# Patient Record
Sex: Male | Born: 1945 | Race: White | Hispanic: No | Marital: Married | Smoking: Never smoker
Health system: Southern US, Community
[De-identification: ages and names within clinical notes are randomized; demographics above are authoritative.]

## PROBLEM LIST (undated history)

## (undated) DIAGNOSIS — C801 Malignant (primary) neoplasm, unspecified: Secondary | ICD-10-CM

## (undated) DIAGNOSIS — F0390 Unspecified dementia without behavioral disturbance: Secondary | ICD-10-CM

## (undated) DIAGNOSIS — T7840XA Allergy, unspecified, initial encounter: Secondary | ICD-10-CM

## (undated) HISTORY — PX: COLONOSCOPY: SHX174

## (undated) HISTORY — DX: Allergy, unspecified, initial encounter: T78.40XA

## (undated) HISTORY — PX: NO PAST SURGERIES: SHX2092

## (undated) HISTORY — DX: Malignant (primary) neoplasm, unspecified: C80.1

## (undated) HISTORY — DX: Unspecified dementia, unspecified severity, without behavioral disturbance, psychotic disturbance, mood disturbance, and anxiety: F03.90

## (undated) HISTORY — PX: MOHS SURGERY: SUR867

---

## 2004-07-06 ENCOUNTER — Ambulatory Visit: Payer: Self-pay | Admitting: Family Medicine

## 2004-07-22 ENCOUNTER — Ambulatory Visit: Payer: Self-pay | Admitting: Family Medicine

## 2005-01-12 ENCOUNTER — Ambulatory Visit: Payer: Self-pay | Admitting: Gastroenterology

## 2005-01-28 ENCOUNTER — Ambulatory Visit: Payer: Self-pay | Admitting: Gastroenterology

## 2005-01-28 ENCOUNTER — Encounter (INDEPENDENT_AMBULATORY_CARE_PROVIDER_SITE_OTHER): Payer: Self-pay | Admitting: Specialist

## 2005-01-28 ENCOUNTER — Encounter: Payer: Self-pay | Admitting: Family Medicine

## 2005-07-26 ENCOUNTER — Encounter: Payer: Self-pay | Admitting: Family Medicine

## 2005-07-26 LAB — CONVERTED CEMR LAB

## 2006-02-02 ENCOUNTER — Ambulatory Visit: Payer: Self-pay | Admitting: Family Medicine

## 2006-02-14 ENCOUNTER — Ambulatory Visit: Payer: Self-pay | Admitting: Family Medicine

## 2007-03-24 ENCOUNTER — Encounter: Payer: Self-pay | Admitting: Family Medicine

## 2007-03-24 DIAGNOSIS — F528 Other sexual dysfunction not due to a substance or known physiological condition: Secondary | ICD-10-CM

## 2007-03-24 DIAGNOSIS — Z860100 Personal history of colon polyps, unspecified: Secondary | ICD-10-CM | POA: Insufficient documentation

## 2007-04-21 ENCOUNTER — Ambulatory Visit: Payer: Self-pay | Admitting: Family Medicine

## 2007-04-21 LAB — CONVERTED CEMR LAB
Albumin: 4.5 g/dL (ref 3.5–5.2)
Alkaline Phosphatase: 73 units/L (ref 39–117)
BUN: 15 mg/dL (ref 6–23)
Basophils Absolute: 0.1 10*3/uL (ref 0.0–0.1)
Bilirubin Urine: NEGATIVE
Cholesterol: 194 mg/dL (ref 0–200)
GFR calc Af Amer: 87 mL/min
Glucose, Urine, Semiquant: NEGATIVE
HDL: 53.3 mg/dL (ref >39.0)
Hemoglobin: 16 g/dL (ref 13.0–17.0)
Lymphocytes Relative: 20.1 % (ref 12.0–46.0)
MCHC: 36 g/dL (ref 30.0–36.0)
MCV: 91.9 fL (ref 78.0–100.0)
Monocytes Absolute: 0.7 10*3/uL (ref 0.2–0.7)
Monocytes Relative: 9.6 % (ref 3.0–11.0)
Neutro Abs: 4.8 10*3/uL (ref 1.4–7.7)
Neutrophils Relative %: 66.9 % (ref 43.0–77.0)
PSA: 2.42 ng/mL (ref 0.10–4.00)
Potassium: 4.9 meq/L (ref 3.5–5.1)
Sodium: 141 meq/L (ref 135–145)
TSH: 1.21 microintl units/mL (ref 0.35–5.50)
Total Protein: 6.8 g/dL (ref 6.0–8.3)
Urobilinogen, UA: NEGATIVE
pH: 5.5

## 2007-04-28 ENCOUNTER — Ambulatory Visit: Payer: Self-pay | Admitting: Family Medicine

## 2008-04-24 ENCOUNTER — Ambulatory Visit: Payer: Self-pay | Admitting: Family Medicine

## 2008-04-24 LAB — CONVERTED CEMR LAB
Albumin: 4.4 g/dL (ref 3.5–5.2)
Alkaline Phosphatase: 68 units/L (ref 39–117)
BUN: 12 mg/dL (ref 6–23)
Basophils Relative: 0.6 % (ref 0.0–3.0)
Blood in Urine, dipstick: NEGATIVE
Calcium: 9.4 mg/dL (ref 8.4–10.5)
Creatinine, Ser: 1 mg/dL (ref 0.4–1.5)
Eosinophils Absolute: 0.2 10*3/uL (ref 0.0–0.7)
Eosinophils Relative: 2.3 % (ref 0.0–5.0)
GFR calc Af Amer: 97 mL/min
GFR calc non Af Amer: 80 mL/min
Glucose, Bld: 100 mg/dL — ABNORMAL HIGH (ref 70–99)
Glucose, Urine, Semiquant: NEGATIVE
HCT: 43.8 % (ref 39.0–52.0)
HDL: 65 mg/dL (ref >39.0)
Hemoglobin: 15.6 g/dL (ref 13.0–17.0)
MCV: 93.9 fL (ref 78.0–100.0)
Monocytes Absolute: 0.5 10*3/uL (ref 0.1–1.0)
Monocytes Relative: 7.8 % (ref 3.0–12.0)
Neutro Abs: 4.3 10*3/uL (ref 1.4–7.7)
Nitrite: NEGATIVE
PSA: 2.53 ng/mL (ref 0.10–4.00)
Platelets: 215 10*3/uL (ref 150–400)
RBC: 4.66 M/uL (ref 4.22–5.81)
Specific Gravity, Urine: 1.015
Total CHOL/HDL Ratio: 2.9
Total Protein: 6.9 g/dL (ref 6.0–8.3)
WBC Urine, dipstick: NEGATIVE
WBC: 6.7 10*3/uL (ref 4.5–10.5)
pH: 7

## 2008-05-07 ENCOUNTER — Ambulatory Visit: Payer: Self-pay | Admitting: Family Medicine

## 2008-05-07 DIAGNOSIS — Z85828 Personal history of other malignant neoplasm of skin: Secondary | ICD-10-CM | POA: Insufficient documentation

## 2008-05-07 DIAGNOSIS — J309 Allergic rhinitis, unspecified: Secondary | ICD-10-CM | POA: Insufficient documentation

## 2009-05-26 ENCOUNTER — Ambulatory Visit: Payer: Self-pay | Admitting: Family Medicine

## 2009-05-26 LAB — CONVERTED CEMR LAB
AST: 37 units/L (ref 0–37)
Alkaline Phosphatase: 74 units/L (ref 39–117)
BUN: 14 mg/dL (ref 6–23)
Basophils Relative: 1 % (ref 0.0–3.0)
Bilirubin Urine: NEGATIVE
Bilirubin, Direct: 0.1 mg/dL (ref 0.0–0.3)
CO2: 31 meq/L (ref 19–32)
Chloride: 105 meq/L (ref 96–112)
Cholesterol: 208 mg/dL — ABNORMAL HIGH (ref 0–200)
Direct LDL: 125.5 mg/dL
Eosinophils Relative: 3.1 % (ref 0.0–5.0)
Glucose, Urine, Semiquant: NEGATIVE
HCT: 43.4 % (ref 39.0–52.0)
MCHC: 35.9 g/dL (ref 30.0–36.0)
MCV: 96.8 fL (ref 78.0–100.0)
Potassium: 4.6 meq/L (ref 3.5–5.1)
Protein, U semiquant: NEGATIVE
RBC: 4.48 M/uL (ref 4.22–5.81)
Total Bilirubin: 1.1 mg/dL (ref 0.3–1.2)
Total CHOL/HDL Ratio: 3
VLDL: 13 mg/dL (ref 0.0–40.0)
WBC Urine, dipstick: NEGATIVE
WBC: 5.8 10*3/uL (ref 4.5–10.5)
pH: 5

## 2009-06-03 ENCOUNTER — Ambulatory Visit: Payer: Self-pay | Admitting: Family Medicine

## 2009-06-03 ENCOUNTER — Encounter (INDEPENDENT_AMBULATORY_CARE_PROVIDER_SITE_OTHER): Payer: Self-pay | Admitting: *Deleted

## 2009-06-03 DIAGNOSIS — R972 Elevated prostate specific antigen [PSA]: Secondary | ICD-10-CM

## 2009-07-31 ENCOUNTER — Ambulatory Visit (HOSPITAL_COMMUNITY): Admission: RE | Admit: 2009-07-31 | Discharge: 2009-07-31 | Payer: Self-pay | Admitting: Urology

## 2009-08-18 ENCOUNTER — Ambulatory Visit: Admission: RE | Admit: 2009-08-18 | Discharge: 2009-10-09 | Payer: Self-pay | Admitting: Radiation Oncology

## 2009-10-13 ENCOUNTER — Ambulatory Visit: Admission: RE | Admit: 2009-10-13 | Discharge: 2010-01-07 | Payer: Self-pay | Admitting: Radiation Oncology

## 2009-10-14 ENCOUNTER — Encounter: Admission: RE | Admit: 2009-10-14 | Discharge: 2009-10-14 | Payer: Self-pay | Admitting: Urology

## 2009-11-27 ENCOUNTER — Ambulatory Visit (HOSPITAL_BASED_OUTPATIENT_CLINIC_OR_DEPARTMENT_OTHER): Admission: RE | Admit: 2009-11-27 | Discharge: 2009-11-27 | Payer: Self-pay | Admitting: Urology

## 2010-01-02 ENCOUNTER — Encounter (INDEPENDENT_AMBULATORY_CARE_PROVIDER_SITE_OTHER): Payer: Self-pay | Admitting: *Deleted

## 2010-01-29 ENCOUNTER — Telehealth: Payer: Self-pay | Admitting: Gastroenterology

## 2010-04-10 ENCOUNTER — Encounter (INDEPENDENT_AMBULATORY_CARE_PROVIDER_SITE_OTHER): Payer: Self-pay | Admitting: *Deleted

## 2010-05-19 ENCOUNTER — Encounter (INDEPENDENT_AMBULATORY_CARE_PROVIDER_SITE_OTHER): Payer: Self-pay | Admitting: *Deleted

## 2010-05-21 ENCOUNTER — Ambulatory Visit: Payer: Self-pay | Admitting: Gastroenterology

## 2010-06-04 ENCOUNTER — Ambulatory Visit: Payer: Self-pay | Admitting: Gastroenterology

## 2010-06-07 ENCOUNTER — Encounter: Payer: Self-pay | Admitting: Gastroenterology

## 2010-06-22 ENCOUNTER — Encounter: Admission: RE | Admit: 2010-06-22 | Discharge: 2010-06-22 | Payer: Self-pay | Admitting: Internal Medicine

## 2010-08-26 NOTE — Miscellaneous (Signed)
Summary: [facility]  Clinical Lists Changes  Medications: Added new medication of MOVIPREP 100 GM  SOLR (PEG-KCL-NACL-NASULF-NA ASC-C) As per prep instructions. - Signed Rx of MOVIPREP 100 GM  SOLR (PEG-KCL-NACL-NASULF-NA ASC-C) As per prep instructions.;  #1 x 0;  Signed;  Entered by: Ezra Sites RN;  Authorized by: Meryl Dare MD Clementeen Graham;  Method used: Electronically to [facility]*, [address] [place], [place], [place]  [address], Ph: [phone], Fax: [phone] Observations: Added new observation of ALLERGY REV: Done (05/21/2010 9:59)    Prescriptions: MOVIPREP 100 GM  SOLR (PEG-KCL-NACL-NASULF-NA ASC-C) As per prep instructions.  #1 x 0   Entered by:   Ezra Sites RN   Authorized by:   Meryl Dare MD [facility]   Signed by:   Ezra Sites RN on 05/21/2010   Method used:   Electronically to        [facility]* (retail)       [address] [place], [place]  [address]       Ph: [phone]       Fax: [phone]   RxID:   [id number]

## 2010-08-26 NOTE — Letter (Signed)
Summary: Pre Visit Letter Revised  [facility]  [address] [place], [place] [address]   Phone: [phone]  Fax: [phone]        04/10/2010 MRN: [id number] Jeff Simpson [address] [place], [place]  [address]             Procedure Date:  06-04-10   Welcome to the Gastroenterology Division at [facility].    You are scheduled to see a nurse for your pre-procedure visit on 05-21-10 at 10:00a.m. on the 3rd floor at [facility], [address]  We ask that you try to arrive at our office 15 minutes prior to your appointment time to allow for check-in.  Please take a minute to review the attached form.  If you answer "Yes" to one or more of the questions on the first page, we ask that you call the person listed at your earliest opportunity.  If you answer "No" to all of the questions, please complete the rest of the form and bring it to your appointment.    Your nurse visit will consist of discussing your medical and surgical history, your immediate family medical history, and your medications.   If you are unable to list all of your medications on the form, please bring the medication bottles to your appointment and we will list them.  We will need to be aware of both prescribed and over the counter drugs.  We will need to know exact dosage information as well.    Please be prepared to read and sign documents such as consent forms, a financial agreement, and acknowledgement forms.  If necessary, and with your consent, a friend or relative is welcome to sit-in on the nurse visit with you.  Please bring your insurance card so that we may make a copy of it.  If your insurance requires a referral to see a specialist, please bring your referral form from your primary care physician.  No co-pay is required for this nurse visit.     If you cannot keep your appointment, please call [phone] to cancel or reschedule prior to your appointment date.  This allows  [place] the opportunity to schedule an appointment for another patient in need of care.    Thank you for choosing [facility] for your medical needs.  We appreciate the opportunity to care for you.  Please visit [place] at our website  to learn more about our practice.  Sincerely, The Gastroenterology Division

## 2010-08-26 NOTE — Letter (Signed)
Summary: Colonoscopy Letter  [facility]  [address] [place], [place] [address]   Phone: [phone]  Fax: [phone]      January 02, 2010 MRN: [id number]   Jeff Simpson [address] [place], [place]  [address]   Dear Jeff Simpson,   According to your medical record, it is time for you to schedule a Colonoscopy. The American Cancer Society recommends this procedure as a method to detect early colon cancer. Patients with a family history of colon cancer, or a personal history of colon polyps or inflammatory bowel disease are at increased risk.  This letter has beeen generated based on the recommendations made at the time of your procedure. If you feel that in your particular situation this may no longer apply, please contact our office.  Please call our office at [phone] to schedule this appointment or to update your records at your earliest convenience.  Thank you for cooperating with [place] to provide you with the very best care possible.   Sincerely,  Judie Petit T. Russella Dar, M.D.  [facility] Gastroenterology Division [phone]

## 2010-08-26 NOTE — Letter (Signed)
Summary: [facility] Instructions  [facility]  [address] [place], [place] [address]   Phone: [phone]  Fax: [phone]       Jeff Simpson    [birth year]    MRN: [id number]        Procedure Day Dorna Bloom: Lenor Coffin 06/04/10     Arrival Time: 8:30am     Procedure Time: 9:30am     Location of Procedure:                    Juliann Pares _  [facility] (4th Floor)                       PREPARATION FOR COLONOSCOPY WITH MOVIPREP   Starting 5 days prior to your procedure  SATURDAY 11/05  do not eat nuts, seeds, popcorn, corn, beans, peas,  salads, or any raw vegetables.  Do not take any fiber supplements (e.g. Metamucil, Citrucel, and Benefiber).  THE DAY BEFORE YOUR PROCEDURE         DATE:  [facility]  11/09  1.  Drink clear liquids the entire day-NO SOLID FOOD  2.  Do not drink anything colored red or purple.  Avoid juices with pulp.  No orange juice.  3.  Drink at least 64 oz. (8 glasses) of fluid/clear liquids during the day to prevent dehydration and help the prep work efficiently.  CLEAR LIQUIDS INCLUDE: Water Jello Ice Popsicles Tea (sugar ok, no milk/cream) Powdered fruit flavored drinks Coffee (sugar ok, no milk/cream) Gatorade Juice: apple, white grape, white cranberry  Lemonade Clear bullion, consomm, broth Carbonated beverages (any kind) Strained chicken noodle soup Hard Candy                             4.  In the morning, mix first dose of MoviPrep solution:    Empty 1 Pouch A and 1 Pouch B into the disposable container    Add lukewarm drinking water to the top line of the container. Mix to dissolve    Refrigerate (mixed solution should be used within 24 hrs)  5.  Begin drinking the prep at 5:00 p.m. The MoviPrep container is divided by 4 marks.   Every 15 minutes drink the solution down to the next mark (approximately 8 oz) until the full liter is complete.   6.  Follow completed prep with 16 oz of clear liquid of your choice  (Nothing red or purple).  Continue to drink clear liquids until bedtime.  7.  Before going to bed, mix second dose of MoviPrep solution:    Empty 1 Pouch A and 1 Pouch B into the disposable container    Add lukewarm drinking water to the top line of the container. Mix to dissolve    Refrigerate  THE DAY OF YOUR PROCEDURE      DATE: THURSDAY  11/10  Beginning at  4:30 a.m. (5 hours before procedure):         1. Every 15 minutes, drink the solution down to the next mark (approx 8 oz) until the full liter is complete.  2. Follow completed prep with 16 oz. of clear liquid of your choice.    3. You may drink clear liquids until 7:30am  (2 HOURS BEFORE PROCEDURE).   MEDICATION INSTRUCTIONS  Unless otherwise instructed, you should take regular prescription medications with a small sip of water   as early as possible the morning  of your procedure.          OTHER INSTRUCTIONS  You will need a responsible adult at least 67 years of age to accompany you and drive you home.   This person must remain in the waiting room during your procedure.  Wear loose fitting clothing that is easily removed.  Leave jewelry and other valuables at home.  However, you may wish to bring a book to read or  an iPod/MP3 player to listen to music as you wait for your procedure to start.  Remove all body piercing jewelry and leave at home.  Total time from sign-in until discharge is approximately 2-3 hours.  You should go home directly after your procedure and rest.  You can resume normal activities the  day after your procedure.  The day of your procedure you should not:   Drive   Make legal decisions   Operate machinery   Drink alcohol   Return to work  You will receive specific instructions about eating, activities and medications before you leave.    The above instructions have been reviewed and explained to me by   Ezra Sites RN  May 21, 2010 10:23 AM    I fully understand  and can verbalize these instructions _____________________________ Date _________

## 2010-08-26 NOTE — Progress Notes (Signed)
Summary: speak to nurse  Phone Note Call from Patient Call back at [phone]   Caller: Patient Call For: Russella Dar Reason for Call: Talk to Nurse Summary of Call: Patient wants to know how long after seed implant surgery should her wait before having recall col done. Patient hasd surgery in May. (patient is scheduled already for 8-2 colon, but wanted to speak to nurse before cancelling.) Initial call taken by: Tawni Levy,  January 29, 2010 2:16 PM  Follow-up for Phone Call        Dr Russella Dar is it ok for the patient to be a direct, or ov? Follow-up by: Darcey Nora RN, CGRN,  January 29, 2010 2:54 PM  Additional Follow-up for Phone Call Additional follow up Details #1::        OK for direct. He should check with his Urologist as well. Additional Follow-up by: Meryl Dare MD Clementeen Graham,  January 29, 2010 3:32 PM    Additional Follow-up for Phone Call Additional follow up Details #2::    patient advised of for direct colon, but he should speak with his urologist.  He will call back to schedule once he has spoken with him. Follow-up by: Darcey Nora RN, CGRN,  January 29, 2010 3:50 PM

## 2010-08-26 NOTE — Letter (Signed)
Summary: Patient Notice- Polyp Results  [facility]  [address] [place], [place] [address]   Phone: [phone]  Fax: [phone]        June 07, 2010 MRN: [id number]    RUSELL MENEELY [address] [place], [place]  [address]    Dear Mr. LOWDER,  I am pleased to inform you that the colon polyp(s) removed during your recent colonoscopy was (were) found to be benign (no cancer detected) upon pathologic examination.  I recommend you have a repeat colonoscopy examination in 3 years to look for recurrent polyps, as having colon polyps increases your risk for having recurrent polyps or even colon cancer in the future.  Should you develop new or worsening symptoms of abdominal pain, bowel habit changes or bleeding from the rectum or bowels, please schedule an evaluation with either your primary care physician or with me.  Please call us if you are having persistent problems or have questions about your condition that have not been fully answered at this time.  Sincerely,  Meryl Dare MD [facility]  This letter has been electronically signed by your physician.  Appended Document: Patient Notice- Polyp Results letter mailed

## 2010-08-26 NOTE — Procedures (Signed)
Summary: Colonoscopy Report/[facility]  Colonoscopy Report/[facility]   Imported By: Maryln Gottron 12/18/2009 10:23:37  _____________________________________________________________________  External Attachment:    Type:   Image     Comment:   External Document

## 2010-08-26 NOTE — Procedures (Signed)
Summary: Colonoscopy  Patient: Jeff Simpson Note: All result statuses are Final unless otherwise noted.  Tests: (1) Colonoscopy (COL)   COL Colonoscopy           DONE     [facility]     [address]     [place], [place]  [address]           COLONOSCOPY PROCEDURE REPORT     PATIENT:  Jeff Simpson, Jeff Simpson  MR#:  [id number]     BIRTHDATE:  [birth year], 64 yrs. old  GENDER:  male     ENDOSCOPIST:  Judie Petit T. Russella Dar, MD, [facility]           PROCEDURE DATE:  06/04/2010     PROCEDURE:  Colonoscopy with snare polypectomy     ASA CLASS:  Class II     INDICATIONS:  1) surveillance and high-risk screening : 11/2001     MEDICATIONS:   Fentanyl 75 mcg IV, Versed 8 mg IV     DESCRIPTION OF PROCEDURE:   After the risks benefits and     alternatives of the procedure were thoroughly explained, informed     consent was obtained.  Digital rectal exam was performed and     revealed no abnormalities.   The LB [id number] endoscope     was introduced through the anus and advanced to the cecum, which     was identified by both the appendix and ileocecal valve, without     limitations.  The quality of the prep was excellent, using     MoviPrep.  The instrument was then slowly withdrawn as the colon     was fully examined.     <<PROCEDUREIMAGES>>     FINDINGS:  A sessile polyp was found in the cecum. It was 8 mm in     size. Polyp was snared, then cauterized with monopolar cautery.     Retrieval was successful. Scattered diverticula were found in the     ascending colon. Moderate diverticulosis was found in the sigmoid     to descending colon.  This was otherwise a normal examination of     the colon. Retroflexed views in the rectum revealed internal     hemorrhoids, small. The time to cecum =  2.33  minutes. The scope     was then withdrawn (time =  8.67  min) from the patient and the     procedure completed.     COMPLICATIONS:  None           ENDOSCOPIC IMPRESSION:     1) 8 mm sessile  polyp in the cecum     2) Diverticula, scattered in the ascending colon     3) Moderate diverticulosis in the sigmoid to descending colon     4) Internal hemorrhoids           RECOMMENDATIONS:     1) Hold aspirin, aspirin products, and anti-inflamatory     medication for 2 weeks.     2) Await pathology results     3) High fiber diet with liberal fluid intake.     4) Colonoscopy in 5 years pending pathology review           Malcolm T. Russella Dar, MD, Clementeen Graham           CC: Kari Baars, MD           n.     Rosalie DoctorVenita Lick. Stark at 06/04/2010 09:43 AM  Leith, Szafranski, [phone]  Note: An exclamation mark (!) indicates a result that was not dispersed into the flowsheet. Document Creation Date: 06/04/2010 9:44 AM _______________________________________________________________________  (1) Order result status: Final Collection or observation date-time: 06/04/2010 09:33 Requested date-time:  Receipt date-time:  Reported date-time:  Referring Physician:   Ordering Physician: Claudette Head [phone]) Specimen Source:  Source: Launa Grill Order Number: [id number] Lab site:   Appended Document: Colonoscopy     Procedures Next Due Date:    Colonoscopy: 05/2013

## 2010-10-13 LAB — CBC
HCT: 44.4 % (ref 39.0–52.0)
Hemoglobin: 15.4 g/dL (ref 13.0–17.0)
Platelets: 196 10*3/uL (ref 150–400)
RBC: 4.59 MIL/uL (ref 4.22–5.81)
WBC: 6 10*3/uL (ref 4.0–10.5)

## 2010-10-13 LAB — COMPREHENSIVE METABOLIC PANEL
Albumin: 4.6 g/dL (ref 3.5–5.2)
Alkaline Phosphatase: 79 U/L (ref 39–117)
BUN: 8 mg/dL (ref 6–23)
CO2: 29 mEq/L (ref 19–32)
Chloride: 103 mEq/L (ref 96–112)
GFR calc non Af Amer: >60 mL/min (ref >60)
Glucose, Bld: 100 mg/dL — ABNORMAL HIGH (ref 70–99)
Potassium: 4.6 mEq/L (ref 3.5–5.1)
Total Bilirubin: 1.4 mg/dL — ABNORMAL HIGH (ref 0.3–1.2)

## 2010-10-13 LAB — PROTIME-INR: INR: 1 (ref 0.00–1.49)

## 2011-07-13 ENCOUNTER — Other Ambulatory Visit: Payer: Self-pay | Admitting: Dermatology

## 2012-12-07 ENCOUNTER — Other Ambulatory Visit: Payer: Self-pay

## 2013-03-15 ENCOUNTER — Encounter: Payer: Self-pay | Admitting: Gastroenterology

## 2013-04-25 ENCOUNTER — Encounter: Payer: Self-pay | Admitting: Gastroenterology

## 2013-07-10 ENCOUNTER — Encounter: Payer: Self-pay | Admitting: Gastroenterology

## 2013-09-04 ENCOUNTER — Other Ambulatory Visit: Payer: Self-pay

## 2013-12-22 ENCOUNTER — Encounter: Payer: Self-pay | Admitting: Gastroenterology

## 2013-12-28 ENCOUNTER — Telehealth: Payer: Self-pay | Admitting: Gastroenterology

## 2013-12-28 NOTE — Telephone Encounter (Signed)
Patient was due for a recall colon 05/2013.  He is scheduled with the patient's wife for 7/14

## 2014-01-16 ENCOUNTER — Ambulatory Visit (AMBULATORY_SURGERY_CENTER): Payer: Medicare Other | Admitting: *Deleted

## 2014-01-16 VITALS — Ht 70.0 in | Wt 214.8 lb

## 2014-01-16 DIAGNOSIS — Z86010 Personal history of colonic polyps: Secondary | ICD-10-CM

## 2014-01-16 MED ORDER — MOVIPREP 100 G PO SOLR: ORAL | Status: DC

## 2014-01-16 NOTE — Progress Notes (Signed)
Patient denies any allergies to eggs or soy. Patient denies any problems with anesthesia/sedation. Patient denies any oxygen use at home and does not take any diet/weight loss medications. EMMI education assisgned to patient on colonoscopy, this was explained and instructions given to patient. 

## 2014-01-23 ENCOUNTER — Encounter: Payer: Self-pay | Admitting: Gastroenterology

## 2014-02-05 ENCOUNTER — Encounter: Payer: Self-pay | Admitting: Gastroenterology

## 2014-02-05 ENCOUNTER — Ambulatory Visit (AMBULATORY_SURGERY_CENTER): Payer: Medicare Other | Admitting: Gastroenterology

## 2014-02-05 VITALS — BP 129/64 | HR 50 | Temp 96.5°F | Resp 33 | Ht 70.0 in | Wt 214.0 lb

## 2014-02-05 DIAGNOSIS — D126 Benign neoplasm of colon, unspecified: Secondary | ICD-10-CM

## 2014-02-05 DIAGNOSIS — Z86010 Personal history of colonic polyps: Secondary | ICD-10-CM

## 2014-02-05 MED ORDER — SODIUM CHLORIDE 0.9 % IV SOLN: 500.0000 mL | INTRAVENOUS | Status: DC

## 2014-02-05 NOTE — Op Note (Signed)
[facility]  [address] [place], [address]   COLONOSCOPY PROCEDURE REPORT PATIENT: Jeff Simpson, Jeff Simpson  MR#: [id number] BIRTHDATE: 1946-01-20 , 40  yrs. old GENDER: Male ENDOSCOPIST: Ladene Artist, MD, [facility] PROCEDURE DATE:  02/05/2014 PROCEDURE:   Colonoscopy with snare polypectomy First Screening Colonoscopy - Avg.  risk and is 50 yrs.  old or older - No.  Prior Negative Screening - Now for repeat screening. N/A  History of Adenoma - Now for follow-up colonoscopy & has been > or = to 3 yrs.  Yes hx of adenoma.  Has been 3 or more years since last colonoscopy.  Polyps Removed Today? Yes. ASA CLASS:   Class II INDICATIONS:Patient's personal history of adenomatous colon polyps.  MEDICATIONS: MAC sedation, administered by CRNA and propofol (Diprivan) 200mg  IV DESCRIPTION OF PROCEDURE:   After the risks benefits and alternatives of the procedure were thoroughly explained, informed consent was obtained.  A digital rectal exam revealed no abnormalities of the rectum.   The LB [id number]  endoscope was introduced through the anus and advanced to the cecum, which was identified by both the appendix and ileocecal valve. No adverse events experienced.   The quality of the prep was good, using MoviPrep  The instrument was then slowly withdrawn as the colon was fully examined.  COLON FINDINGS: A semi-pedunculated polyp measuring 8 mm in size was found at the hepatic flexure.  A polypectomy was performed with a cold snare.  The resection was complete and the polyp tissue was completely retrieved.   A sessile polyp measuring 5 mm in size was found in the transverse colon.  A polypectomy was performed with a cold snare.  The resection was complete and the polyp tissue was completely retrieved.   Mild diverticulosis was noted in the ascending colon.   Moderate diverticulosis was noted in the transverse colon, descending colon, and sigmoid colon.   The colon was  otherwise normal.  There was no diverticulosis, inflammation, polyps or cancers unless previously stated.  Retroflexed views revealed small internal hemorrhoids. The time to cecum=2 minutes 53 seconds.  Withdrawal time=8 minutes 51 seconds.  The scope was withdrawn and the procedure completed. COMPLICATIONS: There were no complications. ENDOSCOPIC IMPRESSION: 1.   Semi-pedunculated polyp measuring 8 mm at the hepatic flexure; polypectomy performed with a cold snare 2.   Sessile polyp measuring 5 mm in size in the transverse colon; polypectomy performed with a cold snare 3.   Mild diverticulosis in the ascending colon 4.   Moderate diverticulosis in the transverse colon, descending colon, and sigmoid colon 5.   Small internal hemorrhoids RECOMMENDATIONS: 1.  Await pathology results 2.  High fiber diet with liberal fluid intake. 3.  Repeat Colonoscopy in 5 years. eSigned:  Ladene Artist, MD, [facility] 02/05/2014 2:05 PM   cc: Janalyn Rouse, MD

## 2014-02-05 NOTE — Progress Notes (Signed)
A/ox3 pleased with MAC, report to Kristin RN 

## 2014-02-05 NOTE — Patient Instructions (Signed)
YOU HAD AN ENDOSCOPIC PROCEDURE TODAY AT THE [facility]: Refer to the procedure report that was given to you for any specific questions about what was found during the examination.  If the procedure report does not answer your questions, please call your gastroenterologist to clarify.  If you requested that your care partner not be given the details of your procedure findings, then the procedure report has been included in a sealed envelope for you to review at your convenience later.  YOU SHOULD EXPECT: Some feelings of bloating in the abdomen. Passage of more gas than usual.  Walking can help get rid of the air that was put into your GI tract during the procedure and reduce the bloating. If you had a lower endoscopy (such as a colonoscopy or flexible sigmoidoscopy) you may notice spotting of blood in your stool or on the toilet paper. If you underwent a bowel prep for your procedure, then you may not have a normal bowel movement for a few days.  DIET: Your first meal following the procedure should be a light meal and then it is ok to progress to your normal diet.  A half-sandwich or bowl of soup is an example of a good first meal.  Heavy or fried foods are harder to digest and may make you feel nauseous or bloated.  Likewise meals heavy in dairy and vegetables can cause extra gas to form and this can also increase the bloating.  Drink plenty of fluids but you should avoid alcoholic beverages for 24 hours.  ACTIVITY: Your care partner should take you home directly after the procedure.  You should plan to take it easy, moving slowly for the rest of the day.  You can resume normal activity the day after the procedure however you should NOT DRIVE or use heavy machinery for 24 hours (because of the sedation medicines used during the test).    SYMPTOMS TO REPORT IMMEDIATELY: A gastroenterologist can be reached at any hour.  During normal business hours, 8:30 AM to 5:00 PM Monday through Friday,  call [phone].  After hours and on weekends, please call the GI answering service at [phone] who will take a message and have the physician on call contact you.   Following lower endoscopy (colonoscopy or flexible sigmoidoscopy):  Excessive amounts of blood in the stool  Significant tenderness or worsening of abdominal pains  Swelling of the abdomen that is new, acute  Fever of 100F or higher  FOLLOW UP: If any biopsies were taken you will be contacted by phone or by letter within the next 1-3 weeks.  Call your gastroenterologist if you have not heard about the biopsies in 3 weeks.  Our staff will call the home number listed on your records the next business day following your procedure to check on you and address any questions or concerns that you may have at that time regarding the information given to you following your procedure. This is a courtesy call and so if there is no answer at the home number and we have not heard from you through the emergency physician on call, we will assume that you have returned to your regular daily activities without incident.  SIGNATURES/CONFIDENTIALITY: You and/or your care partner have signed paperwork which will be entered into your electronic medical record.  These signatures attest to the fact that that the information above on your After Visit Summary has been reviewed and is understood.  Full responsibility of the confidentiality of this   discharge information lies with you and/or your care-partner.  Await pathology  Please continue your normal medications  Please read over handouts about polyps, diverticulosis, hemorrhoids and high fiber diets  Next colonoscopy in 5 years

## 2014-02-05 NOTE — Progress Notes (Signed)
Called to room to assist during endoscopic procedure.  Patient ID and intended procedure confirmed with present staff. Received instructions for my participation in the procedure from the performing physician.  

## 2014-02-06 ENCOUNTER — Telehealth: Payer: Self-pay | Admitting: *Deleted

## 2014-02-06 NOTE — Telephone Encounter (Signed)
Message left

## 2014-02-12 ENCOUNTER — Encounter: Payer: Self-pay | Admitting: Gastroenterology

## 2014-07-11 ENCOUNTER — Other Ambulatory Visit: Payer: Self-pay

## 2015-12-09 ENCOUNTER — Encounter (HOSPITAL_COMMUNITY): Payer: Self-pay | Admitting: Nurse Practitioner

## 2015-12-09 ENCOUNTER — Emergency Department (HOSPITAL_COMMUNITY)
Admission: EM | Admit: 2015-12-09 | Discharge: 2015-12-09 | Disposition: A | Discharge status: Home / Self Care | Payer: Medicare Other | Attending: Emergency Medicine | Admitting: Emergency Medicine

## 2015-12-09 DIAGNOSIS — Y92480 Sidewalk as the place of occurrence of the external cause: Secondary | ICD-10-CM | POA: Diagnosis not present

## 2015-12-09 DIAGNOSIS — W19XXXA Unspecified fall, initial encounter: Secondary | ICD-10-CM

## 2015-12-09 DIAGNOSIS — S81011A Laceration without foreign body, right knee, initial encounter: Secondary | ICD-10-CM | POA: Diagnosis not present

## 2015-12-09 DIAGNOSIS — Z8546 Personal history of malignant neoplasm of prostate: Secondary | ICD-10-CM | POA: Insufficient documentation

## 2015-12-09 DIAGNOSIS — Z23 Encounter for immunization: Secondary | ICD-10-CM | POA: Insufficient documentation

## 2015-12-09 DIAGNOSIS — T07XXXA Unspecified multiple injuries, initial encounter: Secondary | ICD-10-CM

## 2015-12-09 DIAGNOSIS — Z7982 Long term (current) use of aspirin: Secondary | ICD-10-CM | POA: Diagnosis not present

## 2015-12-09 DIAGNOSIS — S060X0A Concussion without loss of consciousness, initial encounter: Secondary | ICD-10-CM | POA: Diagnosis not present

## 2015-12-09 DIAGNOSIS — W1839XA Other fall on same level, initial encounter: Secondary | ICD-10-CM | POA: Insufficient documentation

## 2015-12-09 DIAGNOSIS — S0031XA Abrasion of nose, initial encounter: Secondary | ICD-10-CM | POA: Diagnosis not present

## 2015-12-09 DIAGNOSIS — F0390 Unspecified dementia without behavioral disturbance: Secondary | ICD-10-CM | POA: Diagnosis not present

## 2015-12-09 DIAGNOSIS — S0990XA Unspecified injury of head, initial encounter: Secondary | ICD-10-CM | POA: Diagnosis present

## 2015-12-09 DIAGNOSIS — Y9301 Activity, walking, marching and hiking: Secondary | ICD-10-CM | POA: Insufficient documentation

## 2015-12-09 DIAGNOSIS — Y998 Other external cause status: Secondary | ICD-10-CM | POA: Diagnosis not present

## 2015-12-09 DIAGNOSIS — Z79899 Other long term (current) drug therapy: Secondary | ICD-10-CM | POA: Insufficient documentation

## 2015-12-09 DIAGNOSIS — S0081XA Abrasion of other part of head, initial encounter: Secondary | ICD-10-CM | POA: Diagnosis not present

## 2015-12-09 MED ORDER — TETANUS-DIPHTH-ACELL PERTUSSIS 5-2.5-18.5 LF-MCG/0.5 IM SUSP
0.5000 mL | Freq: Once | INTRAMUSCULAR | Status: AC
  Administered 2015-12-09: 0.5 mL via INTRAMUSCULAR
  Filled 2015-12-09: qty 0.5

## 2015-12-09 NOTE — ED Notes (Signed)
Cleaned pt's wounds using sterile saline. Bacitracin was then applied to the wounds.

## 2015-12-09 NOTE — Discharge Instructions (Signed)
Use Ibuprofen or Tylenol for pain. Get plenty of rest, use ice on your head.  Stay in a quiet, not simulating, dark environment. No TV, computer use, video games, or cell phone use until headache is resolved completely. Follow Up with primary care physician in 3-4 days for recheck of symptoms.  Return to the emergency department if patient becomes lethargic, begins vomiting or other change in mental status.  Keep wounds clean with mild soap and water. Keep area covered with a topical antibiotic ointment and bandage, keep bandage dry. Ice and elevate for additional pain relief and swelling. Alternate between Ibuprofen and Tylenol for additional pain relief.  Monitor area for signs of infection to include, but not limited to: increasing pain, spreading redness, drainage/pus, worsening swelling, or fevers. Return to emergency department for emergent changing or worsening symptoms.    Head Injury, Adult You have received a head injury. It does not appear serious at this time. Headaches and vomiting are common following head injury. It should be easy to awaken from sleeping. Sometimes it is necessary for you to stay in the emergency department for a while for observation. Sometimes admission to the hospital may be needed. After injuries such as yours, most problems occur within the first 24 hours, but side effects may occur up to 7-10 days after the injury. It is important for you to carefully monitor your condition and contact your health care provider or seek immediate medical care if there is a change in your condition. WHAT ARE THE TYPES OF HEAD INJURIES? Head injuries can be as minor as a bump. Some head injuries can be more severe. More severe head injuries include:  A jarring injury to the brain (concussion).  A bruise of the brain (contusion). This mean there is bleeding in the brain that can cause swelling.  A cracked skull (skull fracture).  Bleeding in the brain that collects, clots, and forms  a bump (hematoma). WHAT CAUSES A HEAD INJURY? A serious head injury is most likely to happen to someone who is in a car wreck and is not wearing a seat belt. Other causes of major head injuries include bicycle or motorcycle accidents, sports injuries, and falls. HOW ARE HEAD INJURIES DIAGNOSED? A complete history of the event leading to the injury and your current symptoms will be helpful in diagnosing head injuries. Many times, pictures of the brain, such as CT or MRI are needed to see the extent of the injury. Often, an overnight hospital stay is necessary for observation.  WHEN SHOULD I SEEK IMMEDIATE MEDICAL CARE?  You should get help right away if:  You have confusion or drowsiness.  You feel sick to your stomach (nauseous) or have continued, forceful vomiting.  You have dizziness or unsteadiness that is getting worse.  You have severe, continued headaches not relieved by medicine. Only take over-the-counter or prescription medicines for pain, fever, or discomfort as directed by your health care provider.  You do not have normal function of the arms or legs or are unable to walk.  You notice changes in the black spots in the center of the colored part of your eye (pupil).  You have a clear or bloody fluid coming from your nose or ears.  You have a loss of vision. During the next 24 hours after the injury, you must stay with someone who can watch you for the warning signs. This person should contact local emergency services (911 in the [place] if you have seizures, you become unconscious,  or you are unable to wake up. HOW CAN I PREVENT A HEAD INJURY IN THE FUTURE? The most important factor for preventing major head injuries is avoiding motor vehicle accidents. To minimize the potential for damage to your head, it is crucial to wear seat belts while riding in motor vehicles. Wearing helmets while bike riding and playing collision sports (like football) is also helpful. Also, avoiding  dangerous activities around the house will further help reduce your risk of head injury.  WHEN CAN I RETURN TO NORMAL ACTIVITIES AND ATHLETICS? You should be reevaluated by your health care provider before returning to these activities. If you have any of the following symptoms, you should not return to activities or contact sports until 1 week after the symptoms have stopped:  Persistent headache.  Dizziness or vertigo.  Poor attention and concentration.  Confusion.  Memory problems.  Nausea or vomiting.  Fatigue or tire easily.  Irritability.  Intolerant of bright lights or loud noises.  Anxiety or depression.  Disturbed sleep. MAKE SURE YOU:   Understand these instructions.  Will watch your condition.  Will get help right away if you are not doing well or get worse.   This information is not intended to replace advice given to you by your health care provider. Make sure you discuss any questions you have with your health care provider.   Document Released: 07/12/2005 Document Revised: 08/02/2014 Document Reviewed: 03/19/2013 Elsevier Interactive Patient Education 2016 [place], Adult A concussion is a brain injury. It is caused by:  A hit to the head.  A quick and sudden movement (jolt) of the head or neck. A concussion is usually not life threatening. Even so, it can cause serious problems. If you had a concussion before, you may have concussion-like problems after a hit to your head. HOME CARE General Instructions  Follow your doctor's directions carefully.  Take medicines only as told by your doctor.  Only take medicines your doctor says are safe.  Do not drink alcohol until your doctor says it is okay. Alcohol and some drugs can slow down healing. They can also put you at risk for further injury.  If you are having trouble remembering things, write them down.  Try to do one thing at a time if you get distracted easily. For example, do  not watch TV while making dinner.  Talk to your family members or close friends when making important decisions.  Follow up with your doctor as told.  Watch your symptoms. Tell others to do the same. Serious problems can sometimes happen after a concussion. Older adults are more likely to have these problems.  Tell your teachers, school nurse, school counselor, coach, Product/process development scientist, or work Freight forwarder about your concussion. Tell them about what you can or cannot do. They should watch to see if:  It gets even harder for you to pay attention or concentrate.  It gets even harder for you to remember things or learn new things.  You need more time than normal to finish things.  You become annoyed (irritable) more than before.  You are not able to deal with stress as well.  You have more problems than before.  Rest. Make sure you:  Get plenty of sleep at night.  Go to sleep early.  Go to bed at the same time every day. Try to wake up at the same time.  Rest during the day.  Take naps when you feel tired.  Limit activities where  you have to think a lot or concentrate. These include:  Doing homework.  Doing work related to a job.  Watching TV.  Using the computer. Returning To Your Regular Activities Return to your normal activities slowly, not all at once. You must give your body and brain enough time to heal.   Do not play sports or do other athletic activities until your doctor says it is okay.  Ask your doctor when you can drive, ride a bicycle, or work other vehicles or machines. Never do these things if you feel dizzy.  Ask your doctor about when you can return to work or school. Preventing Another Concussion It is very important to avoid another brain injury, especially before you have healed. In rare cases, another injury can lead to permanent brain damage, brain swelling, or death. The risk of this is greatest during the first 7-10 days after your injury. Avoid  injuries by:   Wearing a seat belt when riding in a car.  Not drinking too much alcohol.  Avoiding activities that could lead to a second concussion (such as contact sports).  Wearing a helmet when doing activities like:  Biking.  Skiing.  Skateboarding.  Skating.  Making your home safer by:  Removing things from the floor or stairways that could make you trip.  Using grab bars in bathrooms and handrails by stairs.  Placing non-slip mats on floors and in bathtubs.  Improve lighting in dark areas. GET HELP IF:  It gets even harder for you to pay attention or concentrate.  It gets even harder for you to remember things or learn new things.  You need more time than normal to finish things.  You become annoyed (irritable) more than before.  You are not able to deal with stress as well.  You have more problems than before.  You have problems keeping your balance.  You are not able to react quickly when you should. Get help if you have any of these problems for more than 2 weeks:   Lasting (chronic) headaches.  Dizziness or trouble balancing.  Feeling sick to your stomach (nausea).  Seeing (vision) problems.  Being affected by noises or light more than normal.  Feeling sad, low, down in the dumps, blue, gloomy, or empty (depressed).  Mood changes (mood swings).  Feeling of fear or nervousness about what may happen (anxiety).  Feeling annoyed.  Memory problems.  Problems concentrating or paying attention.  Sleep problems.  Feeling tired all the time. GET HELP RIGHT AWAY IF:   You have bad headaches or your headaches get worse.  You have weakness (even if it is in one hand, leg, or part of the face).  You have loss of feeling (numbness).  You feel off balance.  You keep throwing up (vomiting).  You feel tired.  One black center of your eye (pupil) is larger than the other.  You twitch or shake violently (convulse).  Your speech is not  clear (slurred).  You are more confused, easily angered (agitated), or annoyed than before.  You have more trouble resting than before.  You are unable to recognize people or places.  You have neck pain.  It is difficult to wake you up.  You have unusual behavior changes.  You pass out (lose consciousness). MAKE SURE YOU:   Understand these instructions.  Will watch your condition.  Will get help right away if you are not doing well or get worse.   This information is not intended  to replace advice given to you by your health care provider. Make sure you discuss any questions you have with your health care provider.   Document Released: 06/30/2009 Document Revised: 08/02/2014 Document Reviewed: 02/01/2013 Elsevier Interactive Patient Education [employer].

## 2015-12-09 NOTE — ED Provider Notes (Signed)
History  By signing my name below, I, Bea Graff, attest that this documentation has been prepared under the direction and in the presence of [name], [name]. Electronically Signed: Bea Graff, ED Scribe. 12/09/2015. 1:48 PM.  Chief Complaint  Patient presents with  . Fall   Patient is a 70 y.o. male presenting with fall. The history is provided by the patient and medical records. No language interpreter was used.  Fall This is a new problem. The current episode started 3 to 5 hours ago. The problem occurs rarely. The problem has been gradually improving. Associated symptoms include headaches. Pertinent negatives include no chest pain, no abdominal pain and no shortness of breath. Exacerbated by: changing facial expressions. The symptoms are relieved by rest. He has tried nothing for the symptoms. The treatment provided mild relief.    HPI Comments:  Jeff Simpson is a 70 y.o. male with PMHx of dementia and prostate cancer, who presents to the Emergency Department complaining of a mechanical fall that occurred about 3 hours ago. He states he was walking his dog and was pulled down on his right knee and then his face. He reports several abrasions to his face and right knee. He reports associated pain of his face and a resolving HA that he rates at 2-3/10. He describes the pain in his face as soreness which is intermittent and nonradiating. Moving his face increases the pain. Resting his face alleviates the pain. He has not taken anything for pain. He denies any pain in the extremities, neck or back pain, saddle anesthesia or cauda equina symptoms, bowel or bladder incontinence, numbness, tingling or weakness, abdominal pain, nausea, vomiting, LOC, CP, SOB, confusion, light-headedness or dizziness, dental injury or malalignment of teeth, or any other injuries. He denies anticoagulant therapy but reports taking a daily baby ASA. He is unsure of the last tetanus vaccination. He has  been ambulatory without assistance since the fall.   Past Medical History  Diagnosis Date  . Allergy   . Dementia   . Cancer [facility])     prostate cancer; seed implant   Past Surgical History  Procedure Laterality Date  . Colonoscopy    . No past surgeries    . Mohs surgery      under local   Family History  Problem Relation Age of Onset  . Colon cancer Neg Hx    Social History  Substance Use Topics  . Smoking status: Never Smoker   . Smokeless tobacco: Never Used  . Alcohol Use: 14.4 oz/week    24 Cans of beer per week    Review of Systems  HENT: Negative for dental problem and facial swelling.   Eyes: Negative for visual disturbance.  Respiratory: Negative for shortness of breath.   Cardiovascular: Negative for chest pain.  Gastrointestinal: Negative for nausea, vomiting and abdominal pain.  Genitourinary: Negative for difficulty urinating.       No bowel or bladder incontinence  Musculoskeletal: Positive for arthralgias (facial pain). Negative for back pain, gait problem and neck pain.  Skin: Positive for wound. Negative for color change.  Allergic/Immunologic: Negative for immunocompromised state.  Neurological: Positive for headaches. Negative for dizziness, syncope, weakness, light-headedness and numbness.  Hematological: Does not bruise/bleed easily.  Psychiatric/Behavioral: Negative for confusion.   A complete 10 system review of systems was obtained and all systems are negative except as noted in the HPI and PMH.   Allergies  Review of patient's allergies indicates no known allergies.  Home Medications  Prior to Admission medications   Medication Sig Start Date End Date Taking? Authorizing Provider  aspirin 81 MG tablet Take 81 mg by mouth daily.    Historical Provider, MD  b complex vitamins capsule Take 1 capsule by mouth daily.    Historical Provider, MD  calcium carbonate (OS-CAL) 600 MG TABS tablet Take 600 mg by mouth 2 (two) times daily with a  meal.    Historical Provider, MD  cetirizine (ZYRTEC) 10 MG tablet Take 10 mg by mouth daily.    Historical Provider, MD  cholecalciferol (VITAMIN D) 1000 UNITS tablet Take 2,000 Units by mouth daily.    Historical Provider, MD  donepezil (ARICEPT) 10 MG tablet Take 10 mg by mouth at bedtime.    Historical Provider, MD  GLUCOSAMINE-CHONDROITIN PO Take 1 tablet by mouth daily.    Historical Provider, MD  Multiple Vitamins-Minerals (MENS ONE DAILY PO) Take 1 tablet by mouth daily.    Historical Provider, MD  tamsulosin (FLOMAX) 0.4 MG CAPS capsule Take 0.4 mg by mouth daily.    Historical Provider, MD   Triage Vitals: BP 140/85 mmHg  Pulse 74  Temp(Src) 99.4 F (37.4 C) (Oral)  Resp 18  SpO2 97% Physical Exam  Constitutional: He is oriented to person, place, and time. Vital signs are normal. He appears well-developed and well-nourished.  Non-toxic appearance. No distress.  Afebrile, nontoxic, NAD  HENT:  Head: Normocephalic. Head is with abrasion. Head is without raccoon's eyes and without Battle's sign.  Nose: Nose normal. No nasal septal hematoma. No epistaxis.  Mouth/Throat: Uvula is midline, oropharynx is clear and moist and mucous membranes are normal. No trismus in the jaw. Normal dentition. No uvula swelling.  Abrasions to the nasal bridge and right forehead with controlled bleeding, no deep lacerations. Nose clear without epistaxis or septal hematoma. Scalp and face nontender without bony crepitus or deformities, nasal bridge nontender without misalignment. No malocclusion or dental injuries.  Eyes: Conjunctivae and EOM are normal. Pupils are equal, round, and reactive to light. Right eye exhibits no discharge. Left eye exhibits no discharge.  PERRL, EOMI, no nystagmus, no visual field deficits   Neck: Normal range of motion. Neck supple. No spinous process tenderness and no muscular tenderness present. No rigidity. Normal range of motion present.  FROM intact without spinous process  TTP, no bony stepoffs or deformities, no paraspinous muscle TTP or muscle spasms. No rigidity or meningeal signs. No bruising or swelling.   Cardiovascular: Normal rate and intact distal pulses.   Pulmonary/Chest: Effort normal. No respiratory distress.  Abdominal: Normal appearance. He exhibits no distension.  Musculoskeletal: Normal range of motion.       Right knee: He exhibits laceration (abrasion). He exhibits normal range of motion, no swelling, no effusion, no deformity, normal alignment, no LCL laxity, normal patellar mobility and no MCL laxity. No tenderness found.  MAE x4 Strength and sensation grossly intact Distal pulses intact Gait steady Right knee with FROM intact, no joint line or bony TTP, no swelling/effusion/deformity, no bruising or erythema, no warmth, no abnormal alignment or patellar mobility, no varus/valgus laxity, neg anterior drawer test, no crepitus. Small abrasion to knee cap. All spinal levels non TTP with no bony step offs or deformities.  Neurological: He is alert and oriented to person, place, and time. He has normal strength. No cranial nerve deficit or sensory deficit. Coordination and gait normal. GCS eye subscore is 4. GCS verbal subscore is 5. GCS motor subscore is 6.  CN 2-12 grossly  intact A&O x4 GCS 15 Sensation and strength intact Gait nonataxic including with tandem walking Coordination with finger-to-nose WNL Neg pronator drift   Skin: Skin is warm and dry. Abrasion noted. No rash noted.  Psychiatric: He has a normal mood and affect.  Nursing note and vitals reviewed.   ED Course  Procedures (including critical care time) DIAGNOSTIC STUDIES: Oxygen Saturation is 97% on RA, normal by my interpretation.   COORDINATION OF CARE: 1:35 PM- Explained low suspicion of brain bleed but offered to order one if pt desired, but pt declined. Return precautions discussed. Advised pt to take OTC Motrin and Tylenol for pain and apply Neosporin to wounds. Pt  verbalizes understanding and agrees to plan.  Medications - No data to display  Labs Review Labs Reviewed - No data to display  Imaging Review No results found. I have personally reviewed and evaluated these images and lab results as part of my medical decision-making.   EKG Interpretation None      MDM   Final diagnoses:  Fall, initial encounter  Abrasions of multiple sites  Concussion, without loss of consciousness, initial encounter    70 y.o. male here with fall after his dog pulled him down. Abrasions to R knee and forehead/nasal bridge. Mild headache which he states resolved, mild facial pain just from moving the skin with the abrasions. No focal neuro deficits, and no s/sx of basilar skull fx. No blood thinners. Had a long discussion with pt regarding risk of intracranial bleed, relatively low suspicion for this, but discussed risks/benefits of CT head today. After long discussion, he declined wanting further testing, he states he doesn't have much pain and would like to go home. Discussed wound care of abrasions. Tylenol/motrin for pain, ice use discussed. F/up with PCP in 3 days. Will update tetanus. I explained the diagnosis and have given explicit precautions to return to the ER including for any other new or worsening symptoms. The patient understands and accepts the medical plan as it's been dictated and I have answered their questions. Discharge instructions concerning home care and prescriptions have been given. The patient is STABLE and is discharged to home in good condition.   I personally performed the services described in this documentation, which was scribed in my presence. The recorded information has been reviewed and is accurate.  BP 140/85 mmHg  Pulse 74  Temp(Src) 99.4 F (37.4 C) (Oral)  Resp 18  SpO2 97%  No orders of the defined types were placed in this encounter.     Dewitt Hoes Camprubi-Soms, PA-C 12/09/15 [place], MD 12/09/15  [id number]

## 2015-12-09 NOTE — ED Notes (Addendum)
Pt c/o forehead, cheek, nose, R hand and knee pain since his dog pulled him down onto cement sidewalk while he was walking her on the leash. He has abrasions to all of these areas. He denies LOC. He is not taking any blood thinners. He is A&Ox4, breathing easily.

## 2016-12-24 DIAGNOSIS — L570 Actinic keratosis: Secondary | ICD-10-CM | POA: Diagnosis not present

## 2016-12-24 DIAGNOSIS — Z85828 Personal history of other malignant neoplasm of skin: Secondary | ICD-10-CM | POA: Diagnosis not present

## 2016-12-24 DIAGNOSIS — L905 Scar conditions and fibrosis of skin: Secondary | ICD-10-CM | POA: Diagnosis not present

## 2016-12-24 DIAGNOSIS — L821 Other seborrheic keratosis: Secondary | ICD-10-CM | POA: Diagnosis not present

## 2016-12-24 DIAGNOSIS — C44329 Squamous cell carcinoma of skin of other parts of face: Secondary | ICD-10-CM | POA: Diagnosis not present

## 2016-12-24 DIAGNOSIS — D485 Neoplasm of uncertain behavior of skin: Secondary | ICD-10-CM | POA: Diagnosis not present

## 2017-01-17 DIAGNOSIS — H02105 Unspecified ectropion of left lower eyelid: Secondary | ICD-10-CM | POA: Diagnosis not present

## 2017-01-17 DIAGNOSIS — H01003 Unspecified blepharitis right eye, unspecified eyelid: Secondary | ICD-10-CM | POA: Diagnosis not present

## 2017-01-17 DIAGNOSIS — H02106 Unspecified ectropion of left eye, unspecified eyelid: Secondary | ICD-10-CM | POA: Diagnosis not present

## 2017-01-18 DIAGNOSIS — Z85828 Personal history of other malignant neoplasm of skin: Secondary | ICD-10-CM | POA: Diagnosis not present

## 2017-01-18 DIAGNOSIS — C44329 Squamous cell carcinoma of skin of other parts of face: Secondary | ICD-10-CM | POA: Diagnosis not present

## 2017-01-20 DIAGNOSIS — C61 Malignant neoplasm of prostate: Secondary | ICD-10-CM | POA: Diagnosis not present

## 2017-01-20 DIAGNOSIS — R1031 Right lower quadrant pain: Secondary | ICD-10-CM | POA: Diagnosis not present

## 2017-02-01 DIAGNOSIS — C61 Malignant neoplasm of prostate: Secondary | ICD-10-CM | POA: Diagnosis not present

## 2017-02-08 DIAGNOSIS — N401 Enlarged prostate with lower urinary tract symptoms: Secondary | ICD-10-CM | POA: Diagnosis not present

## 2017-02-08 DIAGNOSIS — R3912 Poor urinary stream: Secondary | ICD-10-CM | POA: Diagnosis not present

## 2017-02-08 DIAGNOSIS — C61 Malignant neoplasm of prostate: Secondary | ICD-10-CM | POA: Diagnosis not present

## 2017-04-20 DIAGNOSIS — H3509 Other intraretinal microvascular abnormalities: Secondary | ICD-10-CM | POA: Diagnosis not present

## 2017-04-20 DIAGNOSIS — H35042 Retinal micro-aneurysms, unspecified, left eye: Secondary | ICD-10-CM | POA: Diagnosis not present

## 2017-04-20 DIAGNOSIS — H2513 Age-related nuclear cataract, bilateral: Secondary | ICD-10-CM | POA: Diagnosis not present

## 2017-04-20 DIAGNOSIS — H25013 Cortical age-related cataract, bilateral: Secondary | ICD-10-CM | POA: Diagnosis not present

## 2017-04-23 DIAGNOSIS — Z23 Encounter for immunization: Secondary | ICD-10-CM | POA: Diagnosis not present

## 2017-06-21 DIAGNOSIS — L821 Other seborrheic keratosis: Secondary | ICD-10-CM | POA: Diagnosis not present

## 2017-06-21 DIAGNOSIS — D1801 Hemangioma of skin and subcutaneous tissue: Secondary | ICD-10-CM | POA: Diagnosis not present

## 2017-06-21 DIAGNOSIS — D225 Melanocytic nevi of trunk: Secondary | ICD-10-CM | POA: Diagnosis not present

## 2017-06-21 DIAGNOSIS — Z85828 Personal history of other malignant neoplasm of skin: Secondary | ICD-10-CM | POA: Diagnosis not present

## 2017-06-21 DIAGNOSIS — L570 Actinic keratosis: Secondary | ICD-10-CM | POA: Diagnosis not present

## 2017-06-21 DIAGNOSIS — L814 Other melanin hyperpigmentation: Secondary | ICD-10-CM | POA: Diagnosis not present

## 2017-07-07 DIAGNOSIS — E7849 Other hyperlipidemia: Secondary | ICD-10-CM | POA: Diagnosis not present

## 2017-07-07 DIAGNOSIS — R7301 Impaired fasting glucose: Secondary | ICD-10-CM | POA: Diagnosis not present

## 2017-07-07 DIAGNOSIS — R82998 Other abnormal findings in urine: Secondary | ICD-10-CM | POA: Diagnosis not present

## 2017-07-12 DIAGNOSIS — G309 Alzheimer's disease, unspecified: Secondary | ICD-10-CM | POA: Diagnosis not present

## 2017-07-12 DIAGNOSIS — Z8546 Personal history of malignant neoplasm of prostate: Secondary | ICD-10-CM | POA: Diagnosis not present

## 2017-07-12 DIAGNOSIS — Z86010 Personal history of colonic polyps: Secondary | ICD-10-CM | POA: Diagnosis not present

## 2017-07-12 DIAGNOSIS — N529 Male erectile dysfunction, unspecified: Secondary | ICD-10-CM | POA: Diagnosis not present

## 2017-07-12 DIAGNOSIS — Z0000 Encounter for general adult medical examination without abnormal findings: Secondary | ICD-10-CM | POA: Diagnosis not present

## 2017-07-12 DIAGNOSIS — E7849 Other hyperlipidemia: Secondary | ICD-10-CM | POA: Diagnosis not present

## 2017-07-12 DIAGNOSIS — R7301 Impaired fasting glucose: Secondary | ICD-10-CM | POA: Diagnosis not present

## 2017-07-12 DIAGNOSIS — Z1389 Encounter for screening for other disorder: Secondary | ICD-10-CM | POA: Diagnosis not present

## 2017-07-12 DIAGNOSIS — Z6828 Body mass index (BMI) 28.0-28.9, adult: Secondary | ICD-10-CM | POA: Diagnosis not present

## 2017-07-14 DIAGNOSIS — Z1212 Encounter for screening for malignant neoplasm of rectum: Secondary | ICD-10-CM | POA: Diagnosis not present

## 2017-07-21 DIAGNOSIS — H04552 Acquired stenosis of left nasolacrimal duct: Secondary | ICD-10-CM | POA: Diagnosis not present

## 2017-07-21 DIAGNOSIS — H04222 Epiphora due to insufficient drainage, left lacrimal gland: Secondary | ICD-10-CM | POA: Diagnosis not present

## 2017-07-21 DIAGNOSIS — H02106 Unspecified ectropion of left eye, unspecified eyelid: Secondary | ICD-10-CM | POA: Diagnosis not present

## 2017-07-21 DIAGNOSIS — H0014 Chalazion left upper eyelid: Secondary | ICD-10-CM | POA: Diagnosis not present

## 2017-07-21 DIAGNOSIS — H04551 Acquired stenosis of right nasolacrimal duct: Secondary | ICD-10-CM | POA: Diagnosis not present

## 2018-01-04 DIAGNOSIS — Z85828 Personal history of other malignant neoplasm of skin: Secondary | ICD-10-CM | POA: Diagnosis not present

## 2018-01-04 DIAGNOSIS — L720 Epidermal cyst: Secondary | ICD-10-CM | POA: Diagnosis not present

## 2018-01-04 DIAGNOSIS — L570 Actinic keratosis: Secondary | ICD-10-CM | POA: Diagnosis not present

## 2018-01-04 DIAGNOSIS — D225 Melanocytic nevi of trunk: Secondary | ICD-10-CM | POA: Diagnosis not present

## 2018-01-04 DIAGNOSIS — L821 Other seborrheic keratosis: Secondary | ICD-10-CM | POA: Diagnosis not present

## 2018-01-04 DIAGNOSIS — L853 Xerosis cutis: Secondary | ICD-10-CM | POA: Diagnosis not present

## 2018-01-04 DIAGNOSIS — D692 Other nonthrombocytopenic purpura: Secondary | ICD-10-CM | POA: Diagnosis not present

## 2018-01-04 DIAGNOSIS — D044 Carcinoma in situ of skin of scalp and neck: Secondary | ICD-10-CM | POA: Diagnosis not present

## 2018-01-04 DIAGNOSIS — D485 Neoplasm of uncertain behavior of skin: Secondary | ICD-10-CM | POA: Diagnosis not present

## 2018-01-10 DIAGNOSIS — R300 Dysuria: Secondary | ICD-10-CM | POA: Diagnosis not present

## 2018-01-17 DIAGNOSIS — G309 Alzheimer's disease, unspecified: Secondary | ICD-10-CM | POA: Diagnosis not present

## 2018-02-08 DIAGNOSIS — C61 Malignant neoplasm of prostate: Secondary | ICD-10-CM | POA: Diagnosis not present

## 2018-02-15 DIAGNOSIS — N401 Enlarged prostate with lower urinary tract symptoms: Secondary | ICD-10-CM | POA: Diagnosis not present

## 2018-02-15 DIAGNOSIS — C61 Malignant neoplasm of prostate: Secondary | ICD-10-CM | POA: Diagnosis not present

## 2018-02-15 DIAGNOSIS — R3912 Poor urinary stream: Secondary | ICD-10-CM | POA: Diagnosis not present

## 2018-05-10 DIAGNOSIS — H25013 Cortical age-related cataract, bilateral: Secondary | ICD-10-CM | POA: Diagnosis not present

## 2018-05-10 DIAGNOSIS — H2513 Age-related nuclear cataract, bilateral: Secondary | ICD-10-CM | POA: Diagnosis not present

## 2018-05-10 DIAGNOSIS — H3509 Other intraretinal microvascular abnormalities: Secondary | ICD-10-CM | POA: Diagnosis not present

## 2018-05-10 DIAGNOSIS — H35042 Retinal micro-aneurysms, unspecified, left eye: Secondary | ICD-10-CM | POA: Diagnosis not present

## 2018-05-16 DIAGNOSIS — Z23 Encounter for immunization: Secondary | ICD-10-CM | POA: Diagnosis not present

## 2018-06-21 DIAGNOSIS — R7301 Impaired fasting glucose: Secondary | ICD-10-CM | POA: Diagnosis not present

## 2018-06-21 DIAGNOSIS — E7849 Other hyperlipidemia: Secondary | ICD-10-CM | POA: Diagnosis not present

## 2018-07-07 DIAGNOSIS — Z8546 Personal history of malignant neoplasm of prostate: Secondary | ICD-10-CM | POA: Diagnosis not present

## 2018-07-07 DIAGNOSIS — Z1389 Encounter for screening for other disorder: Secondary | ICD-10-CM | POA: Diagnosis not present

## 2018-07-07 DIAGNOSIS — R7301 Impaired fasting glucose: Secondary | ICD-10-CM | POA: Diagnosis not present

## 2018-07-07 DIAGNOSIS — G308 Other Alzheimer's disease: Secondary | ICD-10-CM | POA: Diagnosis not present

## 2018-07-07 DIAGNOSIS — E7849 Other hyperlipidemia: Secondary | ICD-10-CM | POA: Diagnosis not present

## 2018-07-07 DIAGNOSIS — Z0000 Encounter for general adult medical examination without abnormal findings: Secondary | ICD-10-CM | POA: Diagnosis not present

## 2018-07-07 DIAGNOSIS — Z86010 Personal history of colonic polyps: Secondary | ICD-10-CM | POA: Diagnosis not present

## 2018-07-07 DIAGNOSIS — Z6828 Body mass index (BMI) 28.0-28.9, adult: Secondary | ICD-10-CM | POA: Diagnosis not present

## 2018-07-12 DIAGNOSIS — Z111 Encounter for screening for respiratory tuberculosis: Secondary | ICD-10-CM | POA: Diagnosis not present

## 2018-08-08 DIAGNOSIS — G301 Alzheimer's disease with late onset: Secondary | ICD-10-CM | POA: Diagnosis not present

## 2018-08-08 DIAGNOSIS — F0281 Dementia in other diseases classified elsewhere with behavioral disturbance: Secondary | ICD-10-CM | POA: Diagnosis not present

## 2018-08-08 DIAGNOSIS — F5101 Primary insomnia: Secondary | ICD-10-CM | POA: Diagnosis not present

## 2018-08-08 DIAGNOSIS — F331 Major depressive disorder, recurrent, moderate: Secondary | ICD-10-CM | POA: Diagnosis not present

## 2018-08-15 DIAGNOSIS — R2681 Unsteadiness on feet: Secondary | ICD-10-CM | POA: Diagnosis not present

## 2018-08-15 DIAGNOSIS — G301 Alzheimer's disease with late onset: Secondary | ICD-10-CM | POA: Diagnosis not present

## 2018-08-15 DIAGNOSIS — M6281 Muscle weakness (generalized): Secondary | ICD-10-CM | POA: Diagnosis not present

## 2018-08-15 DIAGNOSIS — F331 Major depressive disorder, recurrent, moderate: Secondary | ICD-10-CM | POA: Diagnosis not present

## 2018-08-15 DIAGNOSIS — F5101 Primary insomnia: Secondary | ICD-10-CM | POA: Diagnosis not present

## 2018-08-15 DIAGNOSIS — F0281 Dementia in other diseases classified elsewhere with behavioral disturbance: Secondary | ICD-10-CM | POA: Diagnosis not present

## 2018-08-22 DIAGNOSIS — F0281 Dementia in other diseases classified elsewhere with behavioral disturbance: Secondary | ICD-10-CM | POA: Diagnosis not present

## 2018-08-22 DIAGNOSIS — G301 Alzheimer's disease with late onset: Secondary | ICD-10-CM | POA: Diagnosis not present

## 2018-08-22 DIAGNOSIS — F5101 Primary insomnia: Secondary | ICD-10-CM | POA: Diagnosis not present

## 2018-08-22 DIAGNOSIS — F331 Major depressive disorder, recurrent, moderate: Secondary | ICD-10-CM | POA: Diagnosis not present

## 2018-08-28 DIAGNOSIS — R2681 Unsteadiness on feet: Secondary | ICD-10-CM | POA: Diagnosis not present

## 2018-08-28 DIAGNOSIS — M6281 Muscle weakness (generalized): Secondary | ICD-10-CM | POA: Diagnosis not present

## 2018-09-19 DIAGNOSIS — F331 Major depressive disorder, recurrent, moderate: Secondary | ICD-10-CM | POA: Diagnosis not present

## 2018-09-19 DIAGNOSIS — F5101 Primary insomnia: Secondary | ICD-10-CM | POA: Diagnosis not present

## 2018-09-19 DIAGNOSIS — F0281 Dementia in other diseases classified elsewhere with behavioral disturbance: Secondary | ICD-10-CM | POA: Diagnosis not present

## 2018-09-19 DIAGNOSIS — G3109 Other frontotemporal dementia: Secondary | ICD-10-CM | POA: Diagnosis not present

## 2018-09-19 DIAGNOSIS — G301 Alzheimer's disease with late onset: Secondary | ICD-10-CM | POA: Diagnosis not present

## 2018-09-26 DIAGNOSIS — G301 Alzheimer's disease with late onset: Secondary | ICD-10-CM | POA: Diagnosis not present

## 2018-09-26 DIAGNOSIS — F5101 Primary insomnia: Secondary | ICD-10-CM | POA: Diagnosis not present

## 2018-09-26 DIAGNOSIS — G3109 Other frontotemporal dementia: Secondary | ICD-10-CM | POA: Diagnosis not present

## 2018-09-26 DIAGNOSIS — F0281 Dementia in other diseases classified elsewhere with behavioral disturbance: Secondary | ICD-10-CM | POA: Diagnosis not present

## 2018-09-26 DIAGNOSIS — F331 Major depressive disorder, recurrent, moderate: Secondary | ICD-10-CM | POA: Diagnosis not present

## 2018-10-03 ENCOUNTER — Non-Acute Institutional Stay: Payer: PPO | Admitting: Internal Medicine

## 2018-10-03 ENCOUNTER — Encounter: Payer: Self-pay | Admitting: Internal Medicine

## 2018-10-03 VITALS — BP 120/70 | HR 64 | Resp 12

## 2018-10-03 DIAGNOSIS — Z515 Encounter for palliative care: Secondary | ICD-10-CM | POA: Diagnosis not present

## 2018-10-03 NOTE — Progress Notes (Signed)
March 10th, [place] Note Telephone: [phone]  Fax: [phone]  PATIENT NAME: Jeff Simpson "[name]" DOB: [birth year] MRN: [id number]  [facility] [address] move in date: 09/20/2017   PRIMARY CARE PROVIDER:   Marton Redwood, MD  REFERRING PROVIDER:  Marton Redwood, MD [address] [place], [place] [address]  [phone], ([phone] PCP [facility]  RESPONSIBLE PARTY:  (Spouse GBO) Damarea Merkel [phone] [email or web address], (dtr GBO) [name] ([phone] [email or web address],    (dtr [place]) [name] ([phone]. Family request Palliative Care Consult d/t recent decline in general health.  ASSESSMENT / PLAN:  1. Dementia with behavioral disturbances: FAST 6e. Patient is constantly confused. Oriented to self only. Recognizes wife and son but not by name. Can't differentiate between his 3 triplet daughters. Has behavioral issues of excessive friendliness with male residents has markedly improved with initiation of Celexa and dose adjustment upwards to current  20 mg/d. Episodic anxiety manifest as pacing, or feeling worried that he has something to so but can't remember what it is. Well managed with redirection, and occasional use of prn Clonazepam 0.5mg  (3 tabs total over the last week). Behavioral medication management by Psych Charlette Caffey. Patient ambulates independently without use of assistive devices. He sometimes has difficulty expressing his toileting needs, but is continent as staff are attentive.  No skin breakdown. Oral intake of 100% 3 meals/d. His weight has been stable. Sleeping well; receiving Melatonin and Restoril.   2. Goals of Care: Acceptable behavior management so can stay in [place] without need for sitters. To be safe, cared for, and enjoy the environment and activities that the facility has to offer.   3. Advance Care Directives: DNR on  chart  4. Follow up: NP visit in 2-3 month. Consider scheduling family meeting. Would bring up MOST form at that time. I spoke to both daughter Benjie Karvonen and spouse Hassan Rowan and provided updates. Family was concerned that patient might need sitters, but I rechecked with [facility] coordinator) who felt no longer needed as patient's behavior well managed on current medical regimen.   I spent 90 minutes providing this consultation,  from 10:30am to 12pm. More than 50% of the time in this consultation was spent coordinating communication, reconciling facility [facility] with [facility] EMR, interviewing family and staff, and chart review.   HISTORY OF PRESENT ILLNESS:  VISHAAL Simpson is a 73 y.o. male with medical h/o frontotemporal dementia, prostate cancer (XRT), insomnia, and major depressive disorder. Family requested Palliative Care Consult d/t recent decline in general health.  CODE STATUS: (07/18/2018) DNR (on chart)  PPS: 40% HOSPICE ELIGIBILITY/DIAGNOSIS: No, as prognosis thought greater than 6 months.   PAST MEDICAL HISTORY:  Past Medical History:  Diagnosis Date  . Allergy   . Cancer [facility])    prostate cancer; seed implant  . Dementia ([place])     SOCIAL HX:  Social History   Tobacco Use  . Smoking status: Never Smoker  . Smokeless tobacco: Never Used  Substance Use Topics  . Alcohol use: Yes    Alcohol/week: 24.0 standard drinks    Types: 24 Cans of beer per week    ALLERGIES: No Known Allergies   PERTINENT MEDICATIONS:  Outpatient Encounter Medications as of 10/03/2018  Medication Sig  . cetirizine (ZYRTEC) 10 MG tablet Take 10 mg by mouth daily.  . citalopram (CELEXA) 20 MG tablet Take  20 mg by mouth daily.  . clonazePAM (KLONOPIN) 0.5 MG tablet Take 0.5 mg by mouth 2 (two) times daily as needed for anxiety.  Marland Kitchen dextromethorphan-guaiFENesin (MUCINEX DM) 30-600 MG 12hr tablet Take 1 tablet by mouth 2 (two) times daily as needed for cough.  . Melatonin 10 MG TABS Take 10 mg by  mouth.  . temazepam (RESTORIL) 15 MG capsule Take 15 mg by mouth at bedtime as needed for sleep.  . [DISCONTINUED] aspirin 81 MG tablet Take 81 mg by mouth daily.  . [DISCONTINUED] b complex vitamins capsule Take 1 capsule by mouth daily.  . [DISCONTINUED] calcium carbonate (OS-CAL) 600 MG TABS tablet Take 600 mg by mouth 2 (two) times daily with a meal.  . [DISCONTINUED] cholecalciferol (VITAMIN D) 1000 UNITS tablet Take 2,000 Units by mouth daily.  . [DISCONTINUED] donepezil (ARICEPT) 10 MG tablet Take 10 mg by mouth at bedtime.  . [DISCONTINUED] GLUCOSAMINE-CHONDROITIN PO Take 1 tablet by mouth daily.  . [DISCONTINUED] Multiple Vitamins-Minerals (MENS ONE DAILY PO) Take 1 tablet by mouth daily.  . [DISCONTINUED] tamsulosin (FLOMAX) 0.4 MG CAPS capsule Take 0.4 mg by mouth daily.   No facility-administered encounter medications on file as of 10/03/2018.     PHYSICAL EXAM:  BP 120/70, HR 64, RR 12 Well nourished, pleasant elderly male sitting at dining room table. He follows simple commands but is oriented to self only. He speaks in short 2-4 word sentences only.  Cardiovascular: regular rate and rhythm Pulmonary: clear ant fields, bibasilar inspiratory crackles Abdomen: soft, nontender, + bowel sounds Extremities: no edema, no joint deformities Skin: no rashes Neurological: Weakness but otherwise nonfocal  Julianne Handler, NP

## 2018-10-24 DIAGNOSIS — G3109 Other frontotemporal dementia: Secondary | ICD-10-CM | POA: Diagnosis not present

## 2018-10-24 DIAGNOSIS — G301 Alzheimer's disease with late onset: Secondary | ICD-10-CM | POA: Diagnosis not present

## 2018-10-24 DIAGNOSIS — F5101 Primary insomnia: Secondary | ICD-10-CM | POA: Diagnosis not present

## 2018-10-24 DIAGNOSIS — F331 Major depressive disorder, recurrent, moderate: Secondary | ICD-10-CM | POA: Diagnosis not present

## 2018-10-24 DIAGNOSIS — F0281 Dementia in other diseases classified elsewhere with behavioral disturbance: Secondary | ICD-10-CM | POA: Diagnosis not present

## 2018-11-07 DIAGNOSIS — G3109 Other frontotemporal dementia: Secondary | ICD-10-CM | POA: Diagnosis not present

## 2018-11-07 DIAGNOSIS — F331 Major depressive disorder, recurrent, moderate: Secondary | ICD-10-CM | POA: Diagnosis not present

## 2018-11-07 DIAGNOSIS — F5101 Primary insomnia: Secondary | ICD-10-CM | POA: Diagnosis not present

## 2018-11-07 DIAGNOSIS — G301 Alzheimer's disease with late onset: Secondary | ICD-10-CM | POA: Diagnosis not present

## 2018-11-07 DIAGNOSIS — F0281 Dementia in other diseases classified elsewhere with behavioral disturbance: Secondary | ICD-10-CM | POA: Diagnosis not present

## 2018-11-08 DIAGNOSIS — N390 Urinary tract infection, site not specified: Secondary | ICD-10-CM | POA: Diagnosis not present

## 2018-11-17 DIAGNOSIS — F0281 Dementia in other diseases classified elsewhere with behavioral disturbance: Secondary | ICD-10-CM | POA: Diagnosis not present

## 2018-11-17 DIAGNOSIS — F5101 Primary insomnia: Secondary | ICD-10-CM | POA: Diagnosis not present

## 2018-11-17 DIAGNOSIS — G301 Alzheimer's disease with late onset: Secondary | ICD-10-CM | POA: Diagnosis not present

## 2018-11-17 DIAGNOSIS — F331 Major depressive disorder, recurrent, moderate: Secondary | ICD-10-CM | POA: Diagnosis not present

## 2018-11-17 DIAGNOSIS — G3109 Other frontotemporal dementia: Secondary | ICD-10-CM | POA: Diagnosis not present

## 2018-11-21 DIAGNOSIS — F331 Major depressive disorder, recurrent, moderate: Secondary | ICD-10-CM | POA: Diagnosis not present

## 2018-11-21 DIAGNOSIS — G3109 Other frontotemporal dementia: Secondary | ICD-10-CM | POA: Diagnosis not present

## 2018-11-21 DIAGNOSIS — F0281 Dementia in other diseases classified elsewhere with behavioral disturbance: Secondary | ICD-10-CM | POA: Diagnosis not present

## 2018-11-21 DIAGNOSIS — G301 Alzheimer's disease with late onset: Secondary | ICD-10-CM | POA: Diagnosis not present

## 2018-11-21 DIAGNOSIS — F5101 Primary insomnia: Secondary | ICD-10-CM | POA: Diagnosis not present

## 2018-11-23 DIAGNOSIS — G301 Alzheimer's disease with late onset: Secondary | ICD-10-CM | POA: Diagnosis not present

## 2018-11-23 DIAGNOSIS — F5101 Primary insomnia: Secondary | ICD-10-CM | POA: Diagnosis not present

## 2018-12-05 DIAGNOSIS — F0281 Dementia in other diseases classified elsewhere with behavioral disturbance: Secondary | ICD-10-CM | POA: Diagnosis not present

## 2018-12-05 DIAGNOSIS — F331 Major depressive disorder, recurrent, moderate: Secondary | ICD-10-CM | POA: Diagnosis not present

## 2018-12-05 DIAGNOSIS — F5101 Primary insomnia: Secondary | ICD-10-CM | POA: Diagnosis not present

## 2018-12-05 DIAGNOSIS — G301 Alzheimer's disease with late onset: Secondary | ICD-10-CM | POA: Diagnosis not present

## 2018-12-05 DIAGNOSIS — G3109 Other frontotemporal dementia: Secondary | ICD-10-CM | POA: Diagnosis not present

## 2018-12-19 DIAGNOSIS — F331 Major depressive disorder, recurrent, moderate: Secondary | ICD-10-CM | POA: Diagnosis not present

## 2018-12-19 DIAGNOSIS — G3109 Other frontotemporal dementia: Secondary | ICD-10-CM | POA: Diagnosis not present

## 2018-12-19 DIAGNOSIS — G301 Alzheimer's disease with late onset: Secondary | ICD-10-CM | POA: Diagnosis not present

## 2018-12-19 DIAGNOSIS — F5101 Primary insomnia: Secondary | ICD-10-CM | POA: Diagnosis not present

## 2018-12-19 DIAGNOSIS — F0281 Dementia in other diseases classified elsewhere with behavioral disturbance: Secondary | ICD-10-CM | POA: Diagnosis not present

## 2018-12-24 DIAGNOSIS — F339 Major depressive disorder, recurrent, unspecified: Secondary | ICD-10-CM | POA: Diagnosis not present

## 2019-01-02 DIAGNOSIS — G301 Alzheimer's disease with late onset: Secondary | ICD-10-CM | POA: Diagnosis not present

## 2019-01-02 DIAGNOSIS — F331 Major depressive disorder, recurrent, moderate: Secondary | ICD-10-CM | POA: Diagnosis not present

## 2019-01-02 DIAGNOSIS — F0281 Dementia in other diseases classified elsewhere with behavioral disturbance: Secondary | ICD-10-CM | POA: Diagnosis not present

## 2019-01-02 DIAGNOSIS — F5101 Primary insomnia: Secondary | ICD-10-CM | POA: Diagnosis not present

## 2019-01-02 DIAGNOSIS — G3109 Other frontotemporal dementia: Secondary | ICD-10-CM | POA: Diagnosis not present

## 2019-01-16 DIAGNOSIS — F0281 Dementia in other diseases classified elsewhere with behavioral disturbance: Secondary | ICD-10-CM | POA: Diagnosis not present

## 2019-01-16 DIAGNOSIS — F5101 Primary insomnia: Secondary | ICD-10-CM | POA: Diagnosis not present

## 2019-01-16 DIAGNOSIS — G301 Alzheimer's disease with late onset: Secondary | ICD-10-CM | POA: Diagnosis not present

## 2019-01-16 DIAGNOSIS — G3109 Other frontotemporal dementia: Secondary | ICD-10-CM | POA: Diagnosis not present

## 2019-01-16 DIAGNOSIS — F331 Major depressive disorder, recurrent, moderate: Secondary | ICD-10-CM | POA: Diagnosis not present

## 2019-01-30 DIAGNOSIS — G3109 Other frontotemporal dementia: Secondary | ICD-10-CM | POA: Diagnosis not present

## 2019-01-30 DIAGNOSIS — F331 Major depressive disorder, recurrent, moderate: Secondary | ICD-10-CM | POA: Diagnosis not present

## 2019-01-30 DIAGNOSIS — F0281 Dementia in other diseases classified elsewhere with behavioral disturbance: Secondary | ICD-10-CM | POA: Diagnosis not present

## 2019-01-30 DIAGNOSIS — G301 Alzheimer's disease with late onset: Secondary | ICD-10-CM | POA: Diagnosis not present

## 2019-01-30 DIAGNOSIS — F5101 Primary insomnia: Secondary | ICD-10-CM | POA: Diagnosis not present

## 2019-02-13 DIAGNOSIS — F0281 Dementia in other diseases classified elsewhere with behavioral disturbance: Secondary | ICD-10-CM | POA: Diagnosis not present

## 2019-02-13 DIAGNOSIS — F331 Major depressive disorder, recurrent, moderate: Secondary | ICD-10-CM | POA: Diagnosis not present

## 2019-02-13 DIAGNOSIS — G301 Alzheimer's disease with late onset: Secondary | ICD-10-CM | POA: Diagnosis not present

## 2019-02-13 DIAGNOSIS — F5101 Primary insomnia: Secondary | ICD-10-CM | POA: Diagnosis not present

## 2019-02-14 ENCOUNTER — Encounter: Payer: Self-pay | Admitting: Gastroenterology

## 2019-02-27 DIAGNOSIS — F0281 Dementia in other diseases classified elsewhere with behavioral disturbance: Secondary | ICD-10-CM | POA: Diagnosis not present

## 2019-02-27 DIAGNOSIS — F064 Anxiety disorder due to known physiological condition: Secondary | ICD-10-CM | POA: Diagnosis not present

## 2019-02-27 DIAGNOSIS — G301 Alzheimer's disease with late onset: Secondary | ICD-10-CM | POA: Diagnosis not present

## 2019-03-07 DIAGNOSIS — N390 Urinary tract infection, site not specified: Secondary | ICD-10-CM | POA: Diagnosis not present

## 2019-03-08 DIAGNOSIS — R509 Fever, unspecified: Secondary | ICD-10-CM | POA: Diagnosis not present

## 2019-03-13 DIAGNOSIS — G301 Alzheimer's disease with late onset: Secondary | ICD-10-CM | POA: Diagnosis not present

## 2019-03-13 DIAGNOSIS — F064 Anxiety disorder due to known physiological condition: Secondary | ICD-10-CM | POA: Diagnosis not present

## 2019-03-13 DIAGNOSIS — F5101 Primary insomnia: Secondary | ICD-10-CM | POA: Diagnosis not present

## 2019-03-13 DIAGNOSIS — F0281 Dementia in other diseases classified elsewhere with behavioral disturbance: Secondary | ICD-10-CM | POA: Diagnosis not present

## 2019-03-13 DIAGNOSIS — F331 Major depressive disorder, recurrent, moderate: Secondary | ICD-10-CM | POA: Diagnosis not present

## 2019-03-14 DIAGNOSIS — K4090 Unilateral inguinal hernia, without obstruction or gangrene, not specified as recurrent: Secondary | ICD-10-CM | POA: Diagnosis not present

## 2019-03-14 DIAGNOSIS — G309 Alzheimer's disease, unspecified: Secondary | ICD-10-CM | POA: Diagnosis not present

## 2019-03-14 DIAGNOSIS — R509 Fever, unspecified: Secondary | ICD-10-CM | POA: Diagnosis not present

## 2019-03-14 DIAGNOSIS — F325 Major depressive disorder, single episode, in full remission: Secondary | ICD-10-CM | POA: Diagnosis not present

## 2019-03-14 DIAGNOSIS — R7301 Impaired fasting glucose: Secondary | ICD-10-CM | POA: Diagnosis not present

## 2019-03-20 DIAGNOSIS — R7301 Impaired fasting glucose: Secondary | ICD-10-CM | POA: Diagnosis not present

## 2019-03-20 DIAGNOSIS — E7849 Other hyperlipidemia: Secondary | ICD-10-CM | POA: Diagnosis not present

## 2019-03-27 DIAGNOSIS — G301 Alzheimer's disease with late onset: Secondary | ICD-10-CM | POA: Diagnosis not present

## 2019-03-27 DIAGNOSIS — F064 Anxiety disorder due to known physiological condition: Secondary | ICD-10-CM | POA: Diagnosis not present

## 2019-03-27 DIAGNOSIS — F0281 Dementia in other diseases classified elsewhere with behavioral disturbance: Secondary | ICD-10-CM | POA: Diagnosis not present

## 2019-03-29 DIAGNOSIS — R2689 Other abnormalities of gait and mobility: Secondary | ICD-10-CM | POA: Diagnosis not present

## 2019-03-29 DIAGNOSIS — R269 Unspecified abnormalities of gait and mobility: Secondary | ICD-10-CM | POA: Diagnosis not present

## 2019-03-29 DIAGNOSIS — R2681 Unsteadiness on feet: Secondary | ICD-10-CM | POA: Diagnosis not present

## 2019-04-02 DIAGNOSIS — R2689 Other abnormalities of gait and mobility: Secondary | ICD-10-CM | POA: Diagnosis not present

## 2019-04-02 DIAGNOSIS — R2681 Unsteadiness on feet: Secondary | ICD-10-CM | POA: Diagnosis not present

## 2019-04-02 DIAGNOSIS — R269 Unspecified abnormalities of gait and mobility: Secondary | ICD-10-CM | POA: Diagnosis not present

## 2019-04-09 DIAGNOSIS — R269 Unspecified abnormalities of gait and mobility: Secondary | ICD-10-CM | POA: Diagnosis not present

## 2019-04-09 DIAGNOSIS — R2681 Unsteadiness on feet: Secondary | ICD-10-CM | POA: Diagnosis not present

## 2019-04-09 DIAGNOSIS — R2689 Other abnormalities of gait and mobility: Secondary | ICD-10-CM | POA: Diagnosis not present

## 2019-04-10 DIAGNOSIS — F5101 Primary insomnia: Secondary | ICD-10-CM | POA: Diagnosis not present

## 2019-04-10 DIAGNOSIS — G301 Alzheimer's disease with late onset: Secondary | ICD-10-CM | POA: Diagnosis not present

## 2019-04-10 DIAGNOSIS — F064 Anxiety disorder due to known physiological condition: Secondary | ICD-10-CM | POA: Diagnosis not present

## 2019-04-10 DIAGNOSIS — F331 Major depressive disorder, recurrent, moderate: Secondary | ICD-10-CM | POA: Diagnosis not present

## 2019-04-10 DIAGNOSIS — F0281 Dementia in other diseases classified elsewhere with behavioral disturbance: Secondary | ICD-10-CM | POA: Diagnosis not present

## 2019-04-11 DIAGNOSIS — R2689 Other abnormalities of gait and mobility: Secondary | ICD-10-CM | POA: Diagnosis not present

## 2019-04-11 DIAGNOSIS — R269 Unspecified abnormalities of gait and mobility: Secondary | ICD-10-CM | POA: Diagnosis not present

## 2019-04-11 DIAGNOSIS — R2681 Unsteadiness on feet: Secondary | ICD-10-CM | POA: Diagnosis not present

## 2019-04-16 DIAGNOSIS — R269 Unspecified abnormalities of gait and mobility: Secondary | ICD-10-CM | POA: Diagnosis not present

## 2019-04-16 DIAGNOSIS — R2681 Unsteadiness on feet: Secondary | ICD-10-CM | POA: Diagnosis not present

## 2019-04-16 DIAGNOSIS — R2689 Other abnormalities of gait and mobility: Secondary | ICD-10-CM | POA: Diagnosis not present

## 2019-04-19 DIAGNOSIS — R2689 Other abnormalities of gait and mobility: Secondary | ICD-10-CM | POA: Diagnosis not present

## 2019-04-19 DIAGNOSIS — R269 Unspecified abnormalities of gait and mobility: Secondary | ICD-10-CM | POA: Diagnosis not present

## 2019-04-19 DIAGNOSIS — R2681 Unsteadiness on feet: Secondary | ICD-10-CM | POA: Diagnosis not present

## 2019-04-23 DIAGNOSIS — G301 Alzheimer's disease with late onset: Secondary | ICD-10-CM | POA: Diagnosis not present

## 2019-04-23 DIAGNOSIS — F064 Anxiety disorder due to known physiological condition: Secondary | ICD-10-CM | POA: Diagnosis not present

## 2019-04-23 DIAGNOSIS — F0281 Dementia in other diseases classified elsewhere with behavioral disturbance: Secondary | ICD-10-CM | POA: Diagnosis not present

## 2019-04-23 DIAGNOSIS — F5101 Primary insomnia: Secondary | ICD-10-CM | POA: Diagnosis not present

## 2019-04-24 ENCOUNTER — Non-Acute Institutional Stay: Payer: PPO | Admitting: Internal Medicine

## 2019-04-24 ENCOUNTER — Encounter: Payer: Self-pay | Admitting: Internal Medicine

## 2019-04-24 DIAGNOSIS — Z515 Encounter for palliative care: Secondary | ICD-10-CM | POA: Diagnosis not present

## 2019-04-24 DIAGNOSIS — G301 Alzheimer's disease with late onset: Secondary | ICD-10-CM | POA: Diagnosis not present

## 2019-04-24 DIAGNOSIS — F0281 Dementia in other diseases classified elsewhere with behavioral disturbance: Secondary | ICD-10-CM | POA: Diagnosis not present

## 2019-04-24 DIAGNOSIS — F064 Anxiety disorder due to known physiological condition: Secondary | ICD-10-CM | POA: Diagnosis not present

## 2019-04-24 NOTE — Progress Notes (Signed)
Sept 29th, [place] Consult Note Telephone: [phone]  Fax: [phone]   PATIENT NAME: Jeff Simpson "Jeff Simpson" DOB: 10-08-1945 MRN: [id number]  [facility] [address] move in date: 09/20/2017     PRIMARY CARE PROVIDER:   Marton Redwood, MD   REFERRING PROVIDER:  Marton Redwood, MD [address] [place], [place] [address]  [phone], ([phone] PCP [facility]   RESPONSIBLE PARTY:  (Spouse GBO) Jeff Simpson [id number] [email or web address], (dtr GBO) Jeff Simpson ([phone] [email or web address],    (dtr [place]) Jeff Simpson ([phone]. Family request Palliative Care Consult d/t recent decline in general health.   ASSESSMENT / PLAN:  1.Advance Care Planning:   A.Directives: DNR  B.Goals of Care: Acceptable behavior management so can stay in [place] without need for sitters. To be safe, cared for, and enjoy the environment and activities that the facility has to offer.     2. Dementia with behavioral disturbances: FAST 6e. Patient is constantly confused. Oriented to self only. Staff report his behavior has been calm without agitation. However, I was called to see him d/t rather sudden onset rapid pacing; earlier seemed to be pushing his wheelchair into the path of other residents. At the time of my arrival patient was being evaluated by Psych NP. Patient pacing rapidly about unit. Non-confrontational but unable to be redirected. Gait slightly unsteady. Nursing assistant accompanying patient for steadying assist. Psych prescribed additional dose of Ativan. Effective within about 11min, with patient settling down and relaxing in recliner. Staff report that episode of agitation may have been precipitated by attempts by staff to redirect patient when he was pushing his wheelchair. Continues Celexa 20mg  qd, and Klonopin 0.5mg  bid. -consider addition of low dose Depakote to regimen, if repeat  episodes.   3. Follow up: NP visit in 2-3 month. I'll reach out to spouse with updates.   I spent 30 minutes providing this consultation from 3 to 3:30pm. More than 50% of the time in this consultation was spent coordinating communication, reconciling facility [facility] with [facility] EMR, interviewing staff, and chart review.    HISTORY OF PRESENT ILLNESS:  Jeff Simpson is a 73 y.o. male with medical h/o frontotemporal dementia, prostate cancer (XRT), insomnia, and major depressive disorder. Family requested Palliative Care Consult d/t recent decline in general health. This is a f/u Palliative Care visit from March 10th.    CODE STATUS: (07/18/2018) DNR (on chart)   PPS: 40%  HOSPICE ELIGIBILITY/DIAGNOSIS: No, as prognosis thought greater than 6 months.   PAST MEDICAL HISTORY:  Past Medical History:  Diagnosis Date  . Allergy   . Cancer [facility])    prostate cancer; seed implant  . Dementia ([place])     SOCIAL HX:  Social History   Tobacco Use  . Smoking status: Never Smoker  . Smokeless tobacco: Never Used  Substance Use Topics  . Alcohol use: Yes    Alcohol/week: 24.0 standard drinks    Types: 24 Cans of beer per week    ALLERGIES: No Known Allergies   PERTINENT MEDICATIONS:  Outpatient Encounter Medications as of 04/24/2019  Medication Sig  . cetirizine (ZYRTEC) 10 MG tablet Take 10 mg by mouth daily.  . citalopram (CELEXA) 20 MG tablet Take 20 mg by mouth daily.  . clonazePAM (KLONOPIN) 0.5 MG tablet Take 0.5 mg by mouth 2 (two) times daily as needed for anxiety.  Marland Kitchen dextromethorphan-guaiFENesin [facility] DM)  30-600 MG 12hr tablet Take 1 tablet by mouth 2 (two) times daily as needed for cough.  . Melatonin 10 MG TABS Take 10 mg by mouth.  . temazepam (RESTORIL) 15 MG capsule Take 15 mg by mouth at bedtime as needed for sleep.   No facility-administered encounter medications on file as of 04/24/2019.     PHYSICAL EXAM:   Well nourished, elderly male pacing about main rooms of  memory care unit. He is unable to be redirected. He is non-combative.  He speaks in short 2-4 word sentences only.  PE abbreviated d/t patient pacing about room. He is afebrile by earlier temperature check.  Skin: no rashes exposed areas Neurological: Weakness but otherwise nonfocal  Julianne Handler, NP

## 2019-04-25 ENCOUNTER — Other Ambulatory Visit: Payer: Self-pay

## 2019-04-25 DIAGNOSIS — F331 Major depressive disorder, recurrent, moderate: Secondary | ICD-10-CM | POA: Diagnosis not present

## 2019-04-25 DIAGNOSIS — G301 Alzheimer's disease with late onset: Secondary | ICD-10-CM | POA: Diagnosis not present

## 2019-05-08 DIAGNOSIS — F331 Major depressive disorder, recurrent, moderate: Secondary | ICD-10-CM | POA: Diagnosis not present

## 2019-05-08 DIAGNOSIS — F064 Anxiety disorder due to known physiological condition: Secondary | ICD-10-CM | POA: Diagnosis not present

## 2019-05-08 DIAGNOSIS — G301 Alzheimer's disease with late onset: Secondary | ICD-10-CM | POA: Diagnosis not present

## 2019-05-08 DIAGNOSIS — F5101 Primary insomnia: Secondary | ICD-10-CM | POA: Diagnosis not present

## 2019-05-08 DIAGNOSIS — F0281 Dementia in other diseases classified elsewhere with behavioral disturbance: Secondary | ICD-10-CM | POA: Diagnosis not present

## 2019-05-15 DIAGNOSIS — N390 Urinary tract infection, site not specified: Secondary | ICD-10-CM | POA: Diagnosis not present

## 2019-05-16 DIAGNOSIS — F064 Anxiety disorder due to known physiological condition: Secondary | ICD-10-CM | POA: Diagnosis not present

## 2019-05-16 DIAGNOSIS — F331 Major depressive disorder, recurrent, moderate: Secondary | ICD-10-CM | POA: Diagnosis not present

## 2019-05-16 DIAGNOSIS — G301 Alzheimer's disease with late onset: Secondary | ICD-10-CM | POA: Diagnosis not present

## 2019-05-16 DIAGNOSIS — F0281 Dementia in other diseases classified elsewhere with behavioral disturbance: Secondary | ICD-10-CM | POA: Diagnosis not present

## 2019-05-16 DIAGNOSIS — F5101 Primary insomnia: Secondary | ICD-10-CM | POA: Diagnosis not present

## 2019-05-22 DIAGNOSIS — F5101 Primary insomnia: Secondary | ICD-10-CM | POA: Diagnosis not present

## 2019-05-22 DIAGNOSIS — F0281 Dementia in other diseases classified elsewhere with behavioral disturbance: Secondary | ICD-10-CM | POA: Diagnosis not present

## 2019-05-22 DIAGNOSIS — G301 Alzheimer's disease with late onset: Secondary | ICD-10-CM | POA: Diagnosis not present

## 2019-05-22 DIAGNOSIS — F064 Anxiety disorder due to known physiological condition: Secondary | ICD-10-CM | POA: Diagnosis not present

## 2019-05-22 DIAGNOSIS — F419 Anxiety disorder, unspecified: Secondary | ICD-10-CM | POA: Diagnosis not present

## 2019-05-22 DIAGNOSIS — F339 Major depressive disorder, recurrent, unspecified: Secondary | ICD-10-CM | POA: Diagnosis not present

## 2019-05-22 DIAGNOSIS — F331 Major depressive disorder, recurrent, moderate: Secondary | ICD-10-CM | POA: Diagnosis not present

## 2019-05-31 ENCOUNTER — Non-Acute Institutional Stay: Payer: PPO | Admitting: Internal Medicine

## 2019-05-31 ENCOUNTER — Other Ambulatory Visit: Payer: Self-pay

## 2019-05-31 ENCOUNTER — Encounter: Payer: Self-pay | Admitting: Internal Medicine

## 2019-05-31 DIAGNOSIS — Z515 Encounter for palliative care: Secondary | ICD-10-CM
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
## 2019-06-05 DIAGNOSIS — F0281 Dementia in other diseases classified elsewhere with behavioral disturbance: Secondary | ICD-10-CM | POA: Diagnosis not present

## 2019-06-05 DIAGNOSIS — F5101 Primary insomnia: Secondary | ICD-10-CM | POA: Diagnosis not present

## 2019-06-05 DIAGNOSIS — F331 Major depressive disorder, recurrent, moderate: Secondary | ICD-10-CM | POA: Diagnosis not present

## 2019-06-05 DIAGNOSIS — F064 Anxiety disorder due to known physiological condition: Secondary | ICD-10-CM | POA: Diagnosis not present

## 2019-06-05 DIAGNOSIS — G3109 Other frontotemporal dementia: Secondary | ICD-10-CM | POA: Diagnosis not present

## 2019-06-07 ENCOUNTER — Other Ambulatory Visit: Payer: Self-pay

## 2019-06-07 ENCOUNTER — Inpatient Hospital Stay (HOSPITAL_COMMUNITY)
Admission: EM | Admit: 2019-06-07 | Discharge: 2019-06-26 | DRG: 057 | Disposition: E | Payer: PPO | Source: Skilled Nursing Facility | Attending: Internal Medicine | Admitting: Internal Medicine

## 2019-06-07 DIAGNOSIS — Z7189 Other specified counseling: Secondary | ICD-10-CM | POA: Diagnosis not present

## 2019-06-07 DIAGNOSIS — Z20828 Contact with and (suspected) exposure to other viral communicable diseases: Secondary | ICD-10-CM | POA: Diagnosis present

## 2019-06-07 DIAGNOSIS — F419 Anxiety disorder, unspecified: Secondary | ICD-10-CM | POA: Diagnosis not present

## 2019-06-07 DIAGNOSIS — F0281 Dementia in other diseases classified elsewhere with behavioral disturbance: Secondary | ICD-10-CM | POA: Diagnosis present

## 2019-06-07 DIAGNOSIS — G3183 Dementia with Lewy bodies: Secondary | ICD-10-CM | POA: Diagnosis present

## 2019-06-07 DIAGNOSIS — R0989 Other specified symptoms and signs involving the circulatory and respiratory systems: Secondary | ICD-10-CM | POA: Diagnosis not present

## 2019-06-07 DIAGNOSIS — F339 Major depressive disorder, recurrent, unspecified: Secondary | ICD-10-CM | POA: Diagnosis not present

## 2019-06-07 DIAGNOSIS — R339 Retention of urine, unspecified: Secondary | ICD-10-CM | POA: Diagnosis not present

## 2019-06-07 DIAGNOSIS — G3109 Other frontotemporal dementia: Secondary | ICD-10-CM | POA: Diagnosis not present

## 2019-06-07 DIAGNOSIS — G300 Alzheimer's disease with early onset: Secondary | ICD-10-CM | POA: Diagnosis not present

## 2019-06-07 DIAGNOSIS — Z85828 Personal history of other malignant neoplasm of skin: Secondary | ICD-10-CM

## 2019-06-07 DIAGNOSIS — R456 Violent behavior: Secondary | ICD-10-CM | POA: Diagnosis not present

## 2019-06-07 DIAGNOSIS — R4689 Other symptoms and signs involving appearance and behavior: Secondary | ICD-10-CM | POA: Diagnosis not present

## 2019-06-07 DIAGNOSIS — Z8546 Personal history of malignant neoplasm of prostate: Secondary | ICD-10-CM | POA: Diagnosis not present

## 2019-06-07 DIAGNOSIS — Z66 Do not resuscitate: Secondary | ICD-10-CM | POA: Diagnosis present

## 2019-06-07 DIAGNOSIS — R0602 Shortness of breath: Secondary | ICD-10-CM | POA: Diagnosis not present

## 2019-06-07 DIAGNOSIS — R05 Cough: Secondary | ICD-10-CM | POA: Diagnosis not present

## 2019-06-07 DIAGNOSIS — R52 Pain, unspecified: Secondary | ICD-10-CM | POA: Diagnosis not present

## 2019-06-07 DIAGNOSIS — R918 Other nonspecific abnormal finding of lung field: Secondary | ICD-10-CM | POA: Diagnosis not present

## 2019-06-07 DIAGNOSIS — R059 Cough, unspecified: Secondary | ICD-10-CM

## 2019-06-07 DIAGNOSIS — Z515 Encounter for palliative care: Secondary | ICD-10-CM

## 2019-06-07 DIAGNOSIS — R001 Bradycardia, unspecified: Secondary | ICD-10-CM | POA: Diagnosis not present

## 2019-06-07 DIAGNOSIS — Z86010 Personal history of colonic polyps: Secondary | ICD-10-CM

## 2019-06-07 DIAGNOSIS — Z781 Physical restraint status: Secondary | ICD-10-CM | POA: Diagnosis not present

## 2019-06-07 DIAGNOSIS — R509 Fever, unspecified: Secondary | ICD-10-CM | POA: Diagnosis not present

## 2019-06-07 LAB — COMPREHENSIVE METABOLIC PANEL
ALT: 45 U/L — ABNORMAL HIGH (ref 0–44)
AST: 31 U/L (ref 15–41)
Albumin: 4.1 g/dL (ref 3.5–5.0)
Alkaline Phosphatase: 89 U/L (ref 38–126)
Anion gap: 8 (ref 5–15)
BUN: 18 mg/dL (ref 8–23)
CO2: 26 mmol/L (ref 22–32)
Calcium: 9 mg/dL (ref 8.9–10.3)
Chloride: 105 mmol/L (ref 98–111)
Creatinine, Ser: 1.01 mg/dL (ref 0.61–1.24)
GFR calc Af Amer: >60 mL/min (ref >60)
GFR calc non Af Amer: >60 mL/min (ref >60)
Glucose, Bld: 98 mg/dL (ref 70–99)
Potassium: 4.1 mmol/L (ref 3.5–5.1)
Sodium: 139 mmol/L (ref 135–145)
Total Bilirubin: 0.6 mg/dL (ref 0.3–1.2)
Total Protein: 6.9 g/dL (ref 6.5–8.1)

## 2019-06-07 LAB — CBC
HCT: 44.1 % (ref 39.0–52.0)
Hemoglobin: 14.4 g/dL (ref 13.0–17.0)
MCH: 31.2 pg (ref 26.0–34.0)
MCHC: 32.7 g/dL (ref 30.0–36.0)
MCV: 95.7 fL (ref 80.0–100.0)
Platelets: 184 10*3/uL (ref 150–400)
RBC: 4.61 MIL/uL (ref 4.22–5.81)
RDW: 14 % (ref 11.5–15.5)
WBC: 10.5 10*3/uL (ref 4.0–10.5)
nRBC: 0 % (ref 0.0–0.2)

## 2019-06-07 LAB — ETHANOL: Alcohol, Ethyl (B): <10 mg/dL (ref <10)

## 2019-06-07 LAB — ACETAMINOPHEN LEVEL: Acetaminophen (Tylenol), Serum: <10 ug/mL — ABNORMAL LOW (ref 10–30)

## 2019-06-07 LAB — SALICYLATE LEVEL: Salicylate Lvl: <7 mg/dL (ref 2.8–30.0)
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
## 2019-06-08 ENCOUNTER — Emergency Department (HOSPITAL_COMMUNITY): Payer: PPO

## 2019-06-08 LAB — URINALYSIS, ROUTINE W REFLEX MICROSCOPIC
Bilirubin Urine: NEGATIVE
Glucose, UA: NEGATIVE mg/dL
Hgb urine dipstick: NEGATIVE
Ketones, ur: NEGATIVE mg/dL
Leukocytes,Ua: NEGATIVE
Nitrite: NEGATIVE
Protein, ur: NEGATIVE mg/dL
Specific Gravity, Urine: 1.016 (ref 1.005–1.030)
pH: 7 (ref 5.0–8.0)

## 2019-06-08 LAB — RAPID URINE DRUG SCREEN, HOSP PERFORMED
Amphetamines: NOT DETECTED
Barbiturates: NOT DETECTED
Benzodiazepines: POSITIVE — AB
Cocaine: NOT DETECTED
Opiates: NOT DETECTED
Tetrahydrocannabinol: NOT DETECTED

## 2019-06-08 LAB — SARS CORONAVIRUS 2 (TAT 6-24 HRS): SARS Coronavirus 2: NEGATIVE

## 2019-06-08 MED ORDER — BUSPIRONE HCL 5 MG PO TABS
7.5000 mg | ORAL_TABLET | Freq: Three times a day (TID) | ORAL | Status: DC
  Administered 2019-06-08 – 2019-06-15 (×18): 7.5 mg via ORAL
  Filled 2019-06-08 (×24): qty 1.5

## 2019-06-08 MED ORDER — ZIPRASIDONE MESYLATE 20 MG IM SOLR
20.0000 mg | Freq: Once | INTRAMUSCULAR | Status: AC
  Administered 2019-06-08: 20 mg via INTRAMUSCULAR
  Filled 2019-06-08: qty 20

## 2019-06-08 MED ORDER — DIVALPROEX SODIUM 125 MG PO CSDR
250.0000 mg | DELAYED_RELEASE_CAPSULE | Freq: Every day | ORAL | Status: DC
  Administered 2019-06-08 – 2019-06-12 (×4): 250 mg via ORAL
  Filled 2019-06-08 (×6): qty 2

## 2019-06-08 MED ORDER — MEMANTINE HCL 10 MG PO TABS
10.0000 mg | ORAL_TABLET | Freq: Two times a day (BID) | ORAL | Status: DC
  Administered 2019-06-08 – 2019-06-15 (×13): 10 mg via ORAL
  Filled 2019-06-08 (×16): qty 1

## 2019-06-08 MED ORDER — CLONAZEPAM 0.5 MG PO TABS
0.5000 mg | ORAL_TABLET | Freq: Two times a day (BID) | ORAL | Status: DC | PRN
  Administered 2019-06-08 – 2019-06-15 (×10): 0.5 mg via ORAL
  Filled 2019-06-08 (×13): qty 1

## 2019-06-08 MED ORDER — DIVALPROEX SODIUM 125 MG PO CSDR
125.0000 mg | DELAYED_RELEASE_CAPSULE | Freq: Two times a day (BID) | ORAL | Status: DC
  Administered 2019-06-08 – 2019-06-13 (×9): 125 mg via ORAL
  Filled 2019-06-08 (×12): qty 1

## 2019-06-08 MED ORDER — STERILE WATER FOR INJECTION IJ SOLN
INTRAMUSCULAR | Status: AC
  Administered 2019-06-08: 10 mL
  Filled 2019-06-08: qty 10

## 2019-06-08 MED ORDER — CITALOPRAM HYDROBROMIDE 10 MG PO TABS
20.0000 mg | ORAL_TABLET | Freq: Every day | ORAL | Status: DC
  Administered 2019-06-08 – 2019-06-15 (×8): 20 mg via ORAL
  Filled 2019-06-08 (×8): qty 2

## 2019-06-08 MED ORDER — LORATADINE 10 MG PO TABS
10.0000 mg | ORAL_TABLET | Freq: Every day | ORAL | Status: DC
  Administered 2019-06-08 – 2019-06-15 (×8): 10 mg via ORAL
  Filled 2019-06-08 (×8): qty 1

## 2019-06-08 NOTE — BH Assessment (Signed)
Per [place] Grumbling, NP recommends geropsych tx. TTS to seek placement. EDP [place]ner, Corene Cornea, MD and Syliva Overman, RN have been advised. Patient referred to the following hospitals pending review:                                                                             [facility] Details   [facility] Details   [facility] Details   [facility] Details   [facility] Details   [facility] Details   [facility] Details   [facility] Details   [facility] Details   [facility] Details   [facility] Details   [facility] Details   [facility] Details   [facility] Details   [facility] Details   [facility] Details   [facility] Details   [facility] Details   [place] Details   [facility] Details   [facility] Details   [facility] Details   [facility] Details   [facility] Details   [facility] Details   [facility] Details   [place]

## 2019-06-08 NOTE — ED Notes (Signed)
Asked for urine/ given urinal informed sample needed

## 2019-06-08 NOTE — BH Assessment (Signed)
[facility] Assessment Progress Note  Per [place] Abbot, MD, this pt requires psychiatric hospitalization at this time.  Dr Dwyane Dee also finds that pt meets criteria for IVC, which she has initiated.  IVC documents have been faxed to [facility], and at [employer] confirms receipt.  She has since faxed Findings and Custody Order to this Probation officer.  At 14:42 I called [employer].  The operator then  took demographic information, agreeing to dispatch law enforcement to fill out Return of Service.  Law enforcement then presented at [facility], completing Return of Service.  Placement will be sought for pt.   Jalene Mullet, [place] Coordinator [phone]

## 2019-06-08 NOTE — Progress Notes (Signed)
TTS calling cart, no answer. 
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
## 2019-06-08 NOTE — Progress Notes (Signed)
TTS spoke with Roderic Palau, RN who states he will locate telepsych cart and place in pt's room. Called cart mult x, no answer. Sent message via chat to RN to see if cart is in the room for assessment, no response. There are 7 other consults waiting to be assessed at this time. TTS moving on to the next person waiting to be seen.   Lind Covert, MSW, LCSW Therapeutic Triage Specialist  [phone]

## 2019-06-08 NOTE — Progress Notes (Signed)
Per [place] Grumbling, NP recommends geropsych tx. TTS to seek placement. EDP Mesner, Corene Cornea, MD and Syliva Overman, RN have been advised.  Lind Covert, MSW, LCSW Therapeutic Triage Specialist  [phone]

## 2019-06-08 NOTE — ED Notes (Signed)
TSS recommendation geri psych - looking for placement
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
## 2019-06-08 NOTE — ED Notes (Signed)
TSS machine in room
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
## 2019-06-09 MED ORDER — STERILE WATER FOR INJECTION IJ SOLN
INTRAMUSCULAR | Status: AC
  Administered 2019-06-09: 1.2 mL
  Filled 2019-06-09: qty 10

## 2019-06-09 MED ORDER — ZIPRASIDONE MESYLATE 20 MG IM SOLR
10.0000 mg | Freq: Once | INTRAMUSCULAR | Status: AC
  Administered 2019-06-09: 10 mg via INTRAMUSCULAR
  Filled 2019-06-09: qty 20

## 2019-06-09 MED ORDER — ZIPRASIDONE MESYLATE 20 MG IM SOLR
20.0000 mg | Freq: Once | INTRAMUSCULAR | Status: AC
  Administered 2019-06-09: 07:00:00 20 mg via INTRAMUSCULAR
  Filled 2019-06-09: qty 20

## 2019-06-09 MED ORDER — LORAZEPAM 2 MG/ML IJ SOLN
1.0000 mg | Freq: Once | INTRAMUSCULAR | Status: AC
  Administered 2019-06-09: 1 mg via INTRAMUSCULAR
  Filled 2019-06-09: qty 1
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
## 2019-06-09 NOTE — Consult Note (Addendum)
Patient seen via telepsych today.  Patient does not respond when greeted by this writer and does not answer questions.  Per overnight nursing notes, the patient is agitated and yelling without provocation. Will order prn Geodon 20mg  via injection for agitation.  He continues to meet the inpatient criteria for admission with geriatric psych placement.    Patient seen face-to-face for psychiatric evaluation, chart reviewed and case discussed with the physician extender and developed treatment plan. Reviewed the information documented and agree with the treatment plan. Corena Pilgrim, MD

## 2019-06-09 NOTE — Progress Notes (Signed)
CSW received a call from pt's RN/TTS stating pt's relative is bedside and stated he spoke to [facility] who states they have "an open bed".  CSW called [facility] who stated that they received the pt's referral and the pt was denied to the pt's high acuity level.  CSW will continue to follow for D/C needs.  Alphonse Guild. Sirenity Shew, LCSW, LCAS, CSI Transitions of Care Clinical Social Worker Care Coordination Department Ph: [phone]

## 2019-06-09 NOTE — Progress Notes (Signed)
06/09/2019  0715  Patient yelling, hitting, and kicking at staff.

## 2019-06-09 NOTE — ED Notes (Signed)
Pt is restless this evening refusing to take his po med's. He did receive his IM Ativan injection. I will continue to redirect and reassess.

## 2019-06-10 DIAGNOSIS — R4689 Other symptoms and signs involving appearance and behavior: Secondary | ICD-10-CM
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
## 2019-06-10 NOTE — Consult Note (Addendum)
Patient seen via telepsych today by this writer and Dr. Darleene Cleaver.  Patient does not respond to questions and appears disoriented and is seen looking around the room. Collateral information received from nursing notes, the patient refused his po meds last night and was given prn Ativan for agitation.  He also refused breakfast this am.  His  behaviors continue to vacillate from calm to irritable without specific provocation.   He continues to meet the requirement for inpatient gero psych admission and is awaiting placement.    Patient seen face-to-face for psychiatric evaluation, chart reviewed and case discussed with the physician extender and developed treatment plan. Reviewed the information documented and agree with the treatment plan. Corena Pilgrim, MD

## 2019-06-10 NOTE — ED Notes (Signed)
Pt refusing breakfast at this time. Pt stating " I want to wait while"

## 2019-06-10 NOTE — Progress Notes (Signed)
This patient continues to meet inpatient criteria. CSW re-faxed information to the following facilities:   [facility]  Patient was declined by [facility] on 11/14 due to acuity.  Stephanie Acre, [place] Social Worker [phone]

## 2019-06-10 NOTE — ED Notes (Signed)
Pt resting in bed at this time. Pt is alert and pleasant with staff at this time. Pt is conversing out loud to slef. Pt less anxious at this time.

## 2019-06-10 NOTE — ED Notes (Signed)
Pts son Chip Cordrey at bedside

## 2019-06-10 NOTE — ED Notes (Signed)
Pt becoming increasingly restlress and anxious. Pt remains cooperative and pleasant. PRN administered for anxiety.

## 2019-06-10 NOTE — ED Notes (Signed)
Pts depends and bedding wet. Pts depends, clothing and bed linen changed. Pericare performed. Pt assisted into recliner at this time. Pt remains calm and cooperative

## 2019-06-11 ENCOUNTER — Encounter (HOSPITAL_COMMUNITY): Payer: Self-pay | Admitting: *Deleted

## 2019-06-11 MED ORDER — LORAZEPAM 1 MG PO TABS
2.0000 mg | ORAL_TABLET | Freq: Once | ORAL | Status: AC
  Administered 2019-06-14: 14:00:00 2 mg via ORAL
  Filled 2019-06-11: qty 2

## 2019-06-11 MED ORDER — LORAZEPAM 2 MG/ML IJ SOLN
2.0000 mg | Freq: Once | INTRAMUSCULAR | Status: AC
  Administered 2019-06-11: 2 mg via INTRAMUSCULAR
  Filled 2019-06-11: qty 1

## 2019-06-11 NOTE — Progress Notes (Deleted)
Consult request has been received. CSW attempting to follow up at present time  Shama Monfils, LCSW, LCAS Clinical Social Worker [phone]  

## 2019-06-11 NOTE — ED Notes (Signed)
PT ATTEMPTING TO LEAVE ROOM. MULTIPLE DEVERSIONS AND REDIRECTING USED WITHOUT SUCCESS.  INCREASINGLY AGITATED. PT IS KICKING, HITTING AND ATTEMPTING TO BITE STAFF. MULTIPLE STAFF MEMBERS IN ROOM TO KEEP PT SAFE. EDP TEGELER CALLED FOR MEDICATION TO ASSIST AND UPDATED ON PT'S CURRENT STATUS.

## 2019-06-11 NOTE — ED Notes (Signed)
SECURITY AT BEDSIDE. ALICIA NT AND MORGAN NT AT BEDSIDE ASSISTING WITH PT SAFETY. PT CONTINUES TO SCREAM, VERBALLY CURSE, AND ATTEMPTING TO GET OOB. LIGHTS DIMMED. STAFF CONTINUE TO DE ESCALATE PT.

## 2019-06-11 NOTE — Consult Note (Signed)
  Jeff Simpson, 73 y.o., male patient seen via tele psych by this provider, Dr. Dwyane Dee; and chart reviewed on 06/11/19.  On evaluation [name] sitting up in chair patient was able to state his name and state that he was" doing fine."  Patient was also able to give the name of his wife "Hassan Rowan."  Patient continues to have some disorientation, and confusion.  There has been some improvement patient is speaking and attempting to answer questions today. We will continue to seek [facility] psychiatric inpatient treatment No changes and medications at this time.  Disposition: Continue to seek [facility] psychiatric inpatient treatment Patient has been placed on the [facility] wait list.  [name] B. Arth Nicastro, NP

## 2019-06-11 NOTE — ED Notes (Signed)
PT ATTEMPTING TO LEAVE ROOM. PT PLEASANT AND REORIENTED BACK TO CHAIR. MORGAN NT AND THIS WRITER ENCOURAGED PT BACK TO CHAIR. PT CALM AND COOPERATIVE WITH CARE. DE ESCALATING MEASURES SUCCESSFUL AT PRESENT.
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
## 2019-06-11 NOTE — ED Notes (Signed)
Pt began walking towards door wanting to leave. Would not sit in chair or in bed. Once in bed he became angry and started grabbing at staff and was combative. Nurse gave ativan and pt has been calm and resting.  Will continue to monitor.

## 2019-06-12 ENCOUNTER — Encounter (HOSPITAL_COMMUNITY): Payer: Self-pay | Admitting: Emergency Medicine

## 2019-06-12 LAB — VALPROIC ACID LEVEL: Valproic Acid Lvl: 27 ug/mL — ABNORMAL LOW (ref 50.0–100.0)

## 2019-06-12 NOTE — Progress Notes (Signed)
06/12/2019  Patient wife is concern that if patient's behavior has not improved that [facility] will not accept him back. Family wants to make sure his medicines get adjusted so he can return to [facility] without the aggressive behaviors.
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
## 2019-06-12 NOTE — BH Assessment (Addendum)
[place] Assessment Progress Note  Per Hampton Abbot, MD, this pt continues to require psychiatric hospitalization at this time.  He remains under IVC initiated by Dr Dwyane Dee.  Pt has become combative with ED staff since yesterday.  Nursing notes documenting this episode have been faxed to [facility] along with other updated information.  At 15:48 this writer called [place] and spoke to [place].  She confirms receipt and acknowledges my request for prioritization, although status change and final decision are pending as of this writing.  Earlier today I spoke to pt's wife, Blanchard Alesi [phone]).  She reports that she is pt's health care power of attorney, and agrees to send these documents to [facility] as soon as possible.  I informed her that we are currently seeking placement for the pt in a psychiatric facility, but also that we will be working to stabilize him in the ED, and that if he is stable before placement is found, he will be ready for discharged.  Jalene Mullet, [place] [facility] Coordinator [phone]   Addendum:  Pt has also been referred to [facility] and to [place], [place] Coordinator [phone]

## 2019-06-12 NOTE — ED Notes (Signed)
Pt has been pleasant this morning. Got up walked around with assistance and took a shower. Pt resting comfortably while talking to self at times.

## 2019-06-12 NOTE — Progress Notes (Signed)
06/12/2019  1655  Notified MD that patient's depakote level 27. Waiting for orders.

## 2019-06-12 NOTE — Progress Notes (Signed)
06/12/2019  1130 Depakote level drawn and sent to main lab.

## 2019-06-13 ENCOUNTER — Encounter (HOSPITAL_COMMUNITY): Payer: Self-pay | Admitting: Registered Nurse

## 2019-06-13 MED ORDER — DIVALPROEX SODIUM 125 MG PO CSDR
250.0000 mg | DELAYED_RELEASE_CAPSULE | Freq: Three times a day (TID) | ORAL | Status: DC
  Administered 2019-06-13 – 2019-06-15 (×5): 250 mg via ORAL
  Filled 2019-06-13 (×8): qty 2
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
## 2019-06-13 NOTE — ED Notes (Signed)
Spoke with pt son Dameion regarding pt status.
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
## 2019-06-13 NOTE — BH Assessment (Signed)
11/17- Referred to [facility] . Jeff Simpson @ [facility] confirmed status on the wait list. Per Hampton Abbot, MD, this pt continues to require psychiatric hospitalization at this time.
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
## 2019-06-13 NOTE — ED Notes (Signed)
Patient received meal tray 
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
## 2019-06-13 NOTE — ED Provider Notes (Signed)
Vitals:   06/12/19 2105 06/13/19 0621  BP: 124/71 114/86  Pulse: 65 60  Resp: 18 17  Temp: 98.1 F (36.7 C) 97.9 F (36.6 C)  SpO2: 95% 93%   Pt holding in the ED pending pysych hospitalization.  Medically stable   Dorie Rank, MD 06/13/19 [phone]

## 2019-06-13 NOTE — ED Notes (Signed)
Son visiting at bedside.
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
## 2019-06-14 MED ORDER — LORAZEPAM 1 MG PO TABS
2.0000 mg | ORAL_TABLET | Freq: Once | ORAL | Status: AC
  Administered 2019-06-14: 20:00:00 2 mg via ORAL
  Filled 2019-06-14: qty 2

## 2019-06-14 NOTE — ED Notes (Signed)
Patient restless and not willing to stay in his room. Safety Sitter is walking patient with security escort up and down hall.

## 2019-06-14 NOTE — ED Provider Notes (Signed)
Vitals sable this morning. Attempting to place at [facility]. No issues overnight.   Daleen Bo, MD 06/14/19 0800

## 2019-06-14 NOTE — ED Notes (Signed)
Patient attempting to leave room. Patient redirected to chair. Patient states "I don't care" over and over.
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
## 2019-06-14 NOTE — BH Assessment (Signed)
Currently on the [facility] wait list perJay @ [place]. Per Hampton Abbot, MD, this pt continues to require psychiatric hospitalization at this time.
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
## 2019-06-15 DIAGNOSIS — Z515 Encounter for palliative care: Secondary | ICD-10-CM

## 2019-06-15 DIAGNOSIS — G3183 Dementia with Lewy bodies: Secondary | ICD-10-CM | POA: Diagnosis not present

## 2019-06-15 DIAGNOSIS — F0281 Dementia in other diseases classified elsewhere with behavioral disturbance: Secondary | ICD-10-CM

## 2019-06-15 MED ORDER — MIRTAZAPINE 15 MG PO TBDP
7.5000 mg | ORAL_TABLET | Freq: Every day | ORAL | Status: DC
  Administered 2019-06-15 – 2019-06-24 (×10): 7.5 mg via ORAL
  Filled 2019-06-15 (×12): qty 0.5

## 2019-06-15 MED ORDER — OLANZAPINE 5 MG PO TABS: 5.0000 mg | ORAL_TABLET | Freq: Two times a day (BID) | ORAL | Status: DC | PRN

## 2019-06-15 MED ORDER — MELATONIN 3 MG PO TABS: 3.0000 mg | ORAL_TABLET | Freq: Every day | ORAL | Status: DC

## 2019-06-15 MED ORDER — OLANZAPINE 10 MG IM SOLR
5.0000 mg | Freq: Two times a day (BID) | INTRAMUSCULAR | Status: DC | PRN
  Administered 2019-06-18 – 2019-06-20 (×3): 5 mg via INTRAMUSCULAR
  Filled 2019-06-15 (×4): qty 10

## 2019-06-15 MED ORDER — BUSPIRONE HCL 5 MG PO TABS
2.5000 mg | ORAL_TABLET | Freq: Three times a day (TID) | ORAL | Status: DC
  Administered 2019-06-15 – 2019-06-17 (×6): 2.5 mg via ORAL
  Filled 2019-06-15 (×6): qty 0.5

## 2019-06-15 MED ORDER — LORAZEPAM 2 MG/ML IJ SOLN
2.0000 mg | INTRAMUSCULAR | Status: DC | PRN
  Administered 2019-06-16 – 2019-06-18 (×4): 2 mg via INTRAMUSCULAR
  Filled 2019-06-15 (×4): qty 1

## 2019-06-15 MED ORDER — CLONAZEPAM 1 MG PO TABS: 1.0000 mg | ORAL_TABLET | Freq: Two times a day (BID) | ORAL | Status: DC | PRN

## 2019-06-15 MED ORDER — DIVALPROEX SODIUM 125 MG PO CSDR
500.0000 mg | DELAYED_RELEASE_CAPSULE | Freq: Two times a day (BID) | ORAL | Status: DC
  Administered 2019-06-15 – 2019-06-20 (×11): 500 mg via ORAL
  Filled 2019-06-15 (×11): qty 4

## 2019-06-15 MED ORDER — NON FORMULARY: 3.0000 mg | Freq: Every day | Status: DC

## 2019-06-15 MED ORDER — CLONAZEPAM 1 MG PO TABS
1.0000 mg | ORAL_TABLET | Freq: Three times a day (TID) | ORAL | Status: DC | PRN
  Administered 2019-06-15 – 2019-06-17 (×3): 1 mg via ORAL
  Filled 2019-06-15 (×3): qty 1

## 2019-06-15 MED ORDER — ACETAMINOPHEN 325 MG PO TABS
650.0000 mg | ORAL_TABLET | Freq: Three times a day (TID) | ORAL | Status: DC
  Administered 2019-06-15 – 2019-06-20 (×15): 650 mg via ORAL
  Filled 2019-06-15 (×16): qty 2

## 2019-06-15 MED ORDER — RIVASTIGMINE 13.3 MG/24HR TD PT24
13.3000 mg | MEDICATED_PATCH | Freq: Every day | TRANSDERMAL | Status: DC
  Administered 2019-06-15 – 2019-06-18 (×4): 13.3 mg via TRANSDERMAL
  Filled 2019-06-15 (×4): qty 1

## 2019-06-15 MED ORDER — MELATONIN 3 MG PO TABS
6.0000 mg | ORAL_TABLET | Freq: Every day | ORAL | Status: DC
  Administered 2019-06-15 – 2019-06-19 (×5): 6 mg via ORAL
  Filled 2019-06-15 (×5): qty 2

## 2019-06-15 NOTE — ED Provider Notes (Signed)
Patient awaiting placement.  Per family request, palliative consult has been ordered.   Carmin Muskrat, MD 06/15/19 1316
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
## 2019-06-15 NOTE — BH Assessment (Signed)
[place] Assessment Progress Note  At 09:41 this writer called [facility] and spoke to Ron.  He confirms that pt remains on their priority wait list.  Jalene Mullet, [place] Coordinator [phone]
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
## 2019-06-16 MED ORDER — BACITRACIN ZINC 500 UNIT/GM EX OINT
TOPICAL_OINTMENT | CUTANEOUS | Status: AC
  Administered 2019-06-16: 21:00:00
  Filled 2019-06-16: qty 0.9
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
## 2019-06-17 DIAGNOSIS — F0281 Dementia in other diseases classified elsewhere with behavioral disturbance: Secondary | ICD-10-CM | POA: Diagnosis present

## 2019-06-17 DIAGNOSIS — G3183 Dementia with Lewy bodies: Secondary | ICD-10-CM | POA: Diagnosis not present

## 2019-06-17 MED ORDER — ZIPRASIDONE MESYLATE 20 MG IM SOLR: 20.0000 mg | INTRAMUSCULAR | Status: AC | PRN

## 2019-06-17 MED ORDER — QUETIAPINE FUMARATE 25 MG PO TABS: 25.0000 mg | ORAL_TABLET | ORAL | Status: DC | PRN

## 2019-06-17 MED ORDER — BUSPIRONE HCL 5 MG PO TABS
2.5000 mg | ORAL_TABLET | Freq: Two times a day (BID) | ORAL | Status: DC
  Administered 2019-06-17 – 2019-06-20 (×6): 2.5 mg via ORAL
  Filled 2019-06-17 (×7): qty 0.5

## 2019-06-17 MED ORDER — QUETIAPINE FUMARATE 25 MG PO TABS
25.0000 mg | ORAL_TABLET | Freq: Every day | ORAL | Status: DC
  Administered 2019-06-17 – 2019-06-19 (×3): 25 mg via ORAL
  Filled 2019-06-17 (×3): qty 1

## 2019-06-17 MED ORDER — LORAZEPAM 2 MG/ML PO CONC
2.0000 mg | ORAL | Status: DC
  Administered 2019-06-17 – 2019-06-20 (×12): 2 mg via ORAL
  Filled 2019-06-17 (×12): qty 1

## 2019-06-17 MED ORDER — CLONAZEPAM 1 MG PO TABS: 1.0000 mg | ORAL_TABLET | Freq: Three times a day (TID) | ORAL | Status: DC

## 2019-06-17 MED ORDER — ZIPRASIDONE MESYLATE 20 MG IM SOLR
INTRAMUSCULAR | Status: AC
  Filled 2019-06-17: qty 20

## 2019-06-17 MED ORDER — CLONAZEPAM 1 MG PO TABS: 1.0000 mg | ORAL_TABLET | Freq: Two times a day (BID) | ORAL | Status: DC

## 2019-06-17 MED ORDER — ZIPRASIDONE MESYLATE 20 MG IM SOLR
20.0000 mg | Freq: Once | INTRAMUSCULAR | Status: DC
  Administered 2019-06-17: 20 mg via INTRAMUSCULAR

## 2019-06-17 NOTE — Progress Notes (Signed)
Patient is in bed, ate lunch with assist of NT. He is slightly agitated, talking but difficult to understand, folding towels, moans. He isn't able to carry a conversation with me or indicate if he is having pain or has any needs.   I spoke with his son Chip and updated him by phone-will plan on a family meeting early next week. Continuing to search for available placement options. If patient declines or requires increasing amounts of medication for aggressive behavior he may be appropriate for hospice care at memory care unit.  I made medication adjustments today and discussed his behavioral pattens and medicines with his nurse who knows him well.  1. Added afternoon Seroquel 25mg  2. Scheduled Clonazepam TID 3. IM Ativan and Olanzapine for severe symptoms. 4. Increased Depakote  Lane Hacker, DO Palliative Medicine [phone]  Time: 50 min Greater than 50%  of this time was spent counseling and coordinating care related to the above assessment and plan.
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
## 2019-06-17 NOTE — ED Notes (Signed)
Pt remains in 4 pt soft restraints, Awake, alert & responsive, no distress noted.  Condom catheter in place, draining amber yellow urine. 1-1 sitter at bedside. Calm at present.
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
## 2019-06-18 DIAGNOSIS — G3183 Dementia with Lewy bodies: Secondary | ICD-10-CM | POA: Diagnosis not present

## 2019-06-18 DIAGNOSIS — Z515 Encounter for palliative care: Secondary | ICD-10-CM

## 2019-06-18 DIAGNOSIS — F0281 Dementia in other diseases classified elsewhere with behavioral disturbance: Secondary | ICD-10-CM | POA: Diagnosis not present

## 2019-06-18 DIAGNOSIS — Z66 Do not resuscitate: Secondary | ICD-10-CM | POA: Diagnosis present

## 2019-06-18 MED ORDER — STERILE WATER FOR INJECTION IJ SOLN
INTRAMUSCULAR | Status: AC
  Administered 2019-06-18: 20:00:00 10 mL
  Filled 2019-06-18: qty 10
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
## 2019-06-18 NOTE — Progress Notes (Signed)
Palliative Care Family Meeting  Met with patient's son Chip and conference called in the patient's wife and three daughters.  Goals of Care: We discussed the trajectory of Lewy Body Dementia and the issues surrounding severe agitation and dangerous behavior.  There is no medically reversible condition present contributing to this decline in his stability. The family provides history that for the past 6 months he has been declining at his memory care unit, his gait has gotten worse, he is less interactive and his behavior have become more volatile and unpredictable. They describe how painful it has been to see him suffer like this - they tell me he was never an aggressive man and that they can hardly handle the fact that he is a danger to himself and others.  The family cares deeply about his dignity and QOL. They do not want any heroic interventions or treatment of any conditions that could unnecessarily prolong his life in his current condition. They want him kept safe and comfortable even if that means he has to be mostly in bed and sedated.  Given their stated goals it is most appropriate at this point to consider hospice care and aggressive symptom management of his agitation and combative behavior. Right now he is doing well with the scheduled ativan dosing, he is arousable but resting when undisturbed, he requires full assist with meals but is taking PO in small amounts.  Symptom Management: 1. Continue ATC dosing of Ativan Intensol q4 hours 2. D/C Exelon Patch (he has not done well with this in the past) 3. Continue Depakote if he is able to take the pills-can use sprinkles. 4. Tolerating Seroquel, continue at 67m dose.  Disposition: If he remains in this state consistently for the next couple of days I think it would be appropriate for him to return to either his memory care unit or go to a LTC bed with hospice care services. If he has further decline he may require a hospice facility but  in his current condition he may not qualify for that at this point.  Time: 60 minutes Greater than 50%  of this time was spent counseling and coordinating care related to the above assessment and plan.  ELane Hacker DO Palliative Medicine [phone]
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
## 2019-06-18 NOTE — ED Notes (Signed)
Restraints released at present.  Pt sedated resting at present.  Condom catheter remains in place, draining amber yellow urine. HOB elevated, skin color good, resp even & unlabored.  1-1 sitter at bedside.  Monitoring for safety.

## 2019-06-18 NOTE — BH Assessment (Signed)
[facility] Assessment Progress Note  At 08:18 this writer called [facility] and spoke to [place].  She reports that pt remains on their wait list as a priority referral.  Final disposition is pending as of this writing.  Jalene Mullet, [place] Coordinator [phone]
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
## 2019-06-19 ENCOUNTER — Telehealth: Payer: Self-pay

## 2019-06-19 ENCOUNTER — Encounter (HOSPITAL_COMMUNITY): Payer: Self-pay | Admitting: Registered Nurse

## 2019-06-19 MED ORDER — STERILE WATER FOR INJECTION IJ SOLN
INTRAMUSCULAR | Status: AC
  Administered 2019-06-19: 10 mL
  Filled 2019-06-19: qty 10
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
## 2019-06-19 NOTE — ED Notes (Signed)
Patient started to become more arousable and tried a few times to get out bed. Patient was redirected to not get out of bed and he calmed down.

## 2019-06-19 NOTE — Telephone Encounter (Signed)
Care meeting with [facility] today. Noted patient currently in patient.Updated Hospital Liaison team and Palliative NP

## 2019-06-19 NOTE — BH Assessment (Signed)
[facility] Assessment Progress Note  At 09:46 this writer call [facility] and spoke to Ron.  He reports that pt remains on their wait list as a priority referral.  Final disposition is pending as of this writing.  Jalene Mullet, [place] Coordinator [phone]

## 2019-06-19 NOTE — Progress Notes (Signed)
CSW received consult from Palliative MD asking if patient remained calm and sedated could he go back to [facility] with Hospice. CSW spoke with Legrand Como, [facility] Unit Coordinator, who states he does not believe that patient could remain sedated and in bed at their facility. Also due to patient's behaviors that he has exhibited there and while in the ED that patient would benefit from a higher level of care like inpatient geripsych. Legrand Como reports he will speak to his director and get back with CSW. CSW also spoke with TTS this morning who is still pursuing [facility] for patient. CSW will continue to follow up.  [place], LCSW Transitions of Care Department [facility] ED [phone]

## 2019-06-20 ENCOUNTER — Other Ambulatory Visit: Payer: Self-pay

## 2019-06-20 DIAGNOSIS — Z781 Physical restraint status: Secondary | ICD-10-CM | POA: Diagnosis not present

## 2019-06-20 DIAGNOSIS — R0989 Other specified symptoms and signs involving the circulatory and respiratory systems: Secondary | ICD-10-CM | POA: Diagnosis not present

## 2019-06-20 DIAGNOSIS — Z20828 Contact with and (suspected) exposure to other viral communicable diseases: Secondary | ICD-10-CM | POA: Diagnosis present

## 2019-06-20 DIAGNOSIS — R509 Fever, unspecified: Secondary | ICD-10-CM | POA: Diagnosis not present

## 2019-06-20 DIAGNOSIS — F0281 Dementia in other diseases classified elsewhere with behavioral disturbance: Secondary | ICD-10-CM | POA: Diagnosis present

## 2019-06-20 DIAGNOSIS — R0602 Shortness of breath: Secondary | ICD-10-CM | POA: Diagnosis not present

## 2019-06-20 DIAGNOSIS — Z66 Do not resuscitate: Secondary | ICD-10-CM | POA: Diagnosis present

## 2019-06-20 DIAGNOSIS — Z85828 Personal history of other malignant neoplasm of skin: Secondary | ICD-10-CM | POA: Diagnosis not present

## 2019-06-20 DIAGNOSIS — R339 Retention of urine, unspecified: Secondary | ICD-10-CM | POA: Diagnosis not present

## 2019-06-20 DIAGNOSIS — Z7189 Other specified counseling: Secondary | ICD-10-CM | POA: Diagnosis not present

## 2019-06-20 DIAGNOSIS — Z8546 Personal history of malignant neoplasm of prostate: Secondary | ICD-10-CM | POA: Diagnosis not present

## 2019-06-20 DIAGNOSIS — G300 Alzheimer's disease with early onset: Secondary | ICD-10-CM | POA: Diagnosis present

## 2019-06-20 DIAGNOSIS — R05 Cough: Secondary | ICD-10-CM | POA: Diagnosis present

## 2019-06-20 DIAGNOSIS — Z515 Encounter for palliative care: Secondary | ICD-10-CM | POA: Diagnosis not present

## 2019-06-20 DIAGNOSIS — G3109 Other frontotemporal dementia: Secondary | ICD-10-CM | POA: Diagnosis present

## 2019-06-20 DIAGNOSIS — Z86010 Personal history of colonic polyps: Secondary | ICD-10-CM | POA: Diagnosis not present

## 2019-06-20 DIAGNOSIS — G3183 Dementia with Lewy bodies: Secondary | ICD-10-CM | POA: Diagnosis present

## 2019-06-20 LAB — VALPROIC ACID LEVEL: Valproic Acid Lvl: 58 ug/mL (ref 50.0–100.0)

## 2019-06-20 MED ORDER — ZIPRASIDONE MESYLATE 20 MG IM SOLR
20.0000 mg | INTRAMUSCULAR | Status: AC
  Administered 2019-06-21: 20 mg via INTRAMUSCULAR
  Filled 2019-06-20: qty 20

## 2019-06-20 MED ORDER — LORAZEPAM 2 MG/ML PO CONC
2.0000 mg | ORAL | Status: DC | PRN
  Administered 2019-06-20 – 2019-06-24 (×10): 2 mg via SUBLINGUAL
  Filled 2019-06-20 (×11): qty 1

## 2019-06-20 MED ORDER — LORAZEPAM 2 MG/ML IJ SOLN
2.0000 mg | INTRAMUSCULAR | Status: AC
  Administered 2019-06-20: 2 mg via INTRAMUSCULAR
  Filled 2019-06-20: qty 1

## 2019-06-20 MED ORDER — OLANZAPINE 10 MG IM SOLR: 10.0000 mg | INTRAMUSCULAR | Status: DC

## 2019-06-20 MED ORDER — DIVALPROEX SODIUM 125 MG PO CSDR: 500.0000 mg | DELAYED_RELEASE_CAPSULE | Freq: Every day | ORAL | Status: DC

## 2019-06-20 MED ORDER — STERILE WATER FOR INJECTION IJ SOLN
INTRAMUSCULAR | Status: AC
  Administered 2019-06-20: 10 mL
  Filled 2019-06-20: qty 10

## 2019-06-20 MED ORDER — LORAZEPAM 2 MG/ML PO CONC
4.0000 mg | ORAL | Status: DC
  Administered 2019-06-21 – 2019-06-25 (×28): 4 mg via SUBLINGUAL
  Filled 2019-06-20 (×28): qty 2

## 2019-06-20 MED ORDER — QUETIAPINE FUMARATE 25 MG PO TABS
25.0000 mg | ORAL_TABLET | Freq: Three times a day (TID) | ORAL | Status: DC
  Administered 2019-06-20 – 2019-06-23 (×6): 25 mg via ORAL
  Filled 2019-06-20 (×6): qty 1

## 2019-06-20 MED ORDER — ZIPRASIDONE MESYLATE 20 MG IM SOLR
10.0000 mg | INTRAMUSCULAR | Status: DC | PRN
  Administered 2019-06-21: 10 mg via INTRAMUSCULAR
  Filled 2019-06-20 (×3): qty 20

## 2019-06-20 NOTE — ED Notes (Signed)
Spoke with Jinny Sanders, [facility] states to call report at 19:00 for room 1501.
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
## 2019-06-20 NOTE — BH Assessment (Signed)
[place] Assessment Progress Note  At 08:20 this writer called [facility] and spoke to [place].  She confirms that pt remains on their wait list as a priority referral.  As of this writing, final disposition is pending.  Jalene Mullet, [place] Coordinator [phone]

## 2019-06-20 NOTE — Progress Notes (Signed)
Primary Care Courtesy Note- Jeff Simpson has been my primary care patient since 2011 and he and his family are well known to me.  His dementia has progressed significantly over that time with significant escalation of behavioral issues over the past 4-6 weeks with violent outburst consistent with end-stage Lewy Body Dementia/frontotemporal dementia.  I have discussed this with his family and they are supportive of efforts to maintain patient comfort given the significant decline in his quality of life and safety concerns associated with these outbursts.  I wholeheartedly agree with care as outlined by Dr. Hilma Favors to include palliative sedation to maintain patient comfort and dignity.  I greatly appreciate the care of this man who has always been calm and mild-mannered prior to his decline.  He clearly would not want to live like this.  Please call if I can be of any assistance.    Lang Snow, MD [place] [phone]
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
## 2019-06-20 NOTE — Progress Notes (Signed)
Patient retained urine and bladder scan was done. I &O cath was done with 839ml urine came out. No bleeding symptoms after procedure. Pericare was performed as well.
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
## 2019-06-21 DIAGNOSIS — Z7189 Other specified counseling: Secondary | ICD-10-CM

## 2019-06-21 DIAGNOSIS — Z66 Do not resuscitate: Secondary | ICD-10-CM | POA: Diagnosis not present

## 2019-06-21 DIAGNOSIS — G3183 Dementia with Lewy bodies: Secondary | ICD-10-CM | POA: Diagnosis not present

## 2019-06-21 DIAGNOSIS — Z515 Encounter for palliative care: Secondary | ICD-10-CM | POA: Diagnosis not present

## 2019-06-21 MED ORDER — HYDROMORPHONE HCL 1 MG/ML PO LIQD
0.5000 mg | ORAL | Status: DC | PRN
  Administered 2019-06-23 – 2019-06-24 (×3): 0.5 mg via ORAL
  Filled 2019-06-21 (×4): qty 1

## 2019-06-21 MED ORDER — HYDROMORPHONE HCL 1 MG/ML PO LIQD: 0.5000 mg | ORAL | Status: DC | PRN

## 2019-06-21 NOTE — Progress Notes (Signed)
Daily Progress Note   Patient Name: Jeff Simpson       Date: 06/21/2019 DOB: Jan 31, 1946  Age: 73 y.o. MRN#: [id number] Attending Physician: Acquanetta Chain, DO Primary Care Physician: Marton Redwood, MD Admit Date: 06/17/2019  Reason for Consultation/Follow-up: Establishing goals of care Chart reviewed, patient seen and gently examined. PMT is primary service for this patient currently.   Subjective:  patient is resting in bed, he is not awake. He has a large dressing on his L side of the face, he has mittens with restraints on.   Discussed with bedside safety sitter, patient has been calm thus far. Overnight events including one episode of agitation where in the patient became combative with staff noted.   Medication orders reviewed. No family at bedside currently.   Length of Stay: 1  Current Medications: Scheduled Meds:  . LORazepam  4 mg Sublingual Q4H  . mirtazapine  7.5 mg Oral QHS  . QUEtiapine  25 mg Oral TID    Continuous Infusions:   PRN Meds: LORazepam, ziprasidone  Physical Exam         Well developed well nourished but chronically ill appearing gentleman Currently not awake Has mittens and restraints Abdomen mildly distended Regular respirations Trace edema Not agitated currently  Vital Signs: BP 121/72 (BP Location: Left Arm)   Pulse 76   Temp 97.7 F (36.5 C) (Oral)   Resp 16   Ht 5\' 10"  (1.778 m)   Wt 97.1 kg   SpO2 95%   BMI 30.72 kg/m  SpO2: SpO2: 95 % O2 Device: O2 Device: Room Air O2 Flow Rate:    Intake/output summary:   Intake/Output Summary (Last 24 hours) at 06/21/2019 1041 Last data filed at 06/20/2019 2300 Gross per 24 hour  Intake 120 ml  Output 800 ml  Net -680 ml   LBM:   Baseline Weight: Weight: 97.1 kg Most  recent weight: Weight: 97.1 kg       Palliative Assessment/Data:      Patient Active Problem List   Diagnosis Date Noted  . DNR (do not resuscitate) 06/18/2019  . Palliative care patient 06/18/2019  . Lewy body dementia with behavioral disturbance ([place]) 06/17/2019  . PROSTATE SPECIFIC ANTIGEN, ELEVATED 06/03/2009  . ALLERGIC RHINITIS 05/07/2008  . SKIN CANCER, HX OF 05/07/2008  . ERECTILE DYSFUNCTION  03/24/2007  . COLONIC POLYPS, HX OF 03/24/2007    Palliative Care Assessment & Plan   Patient Profile:  73 yo with progressive Lewy Body Dementia, dangerous and severe behavioral features- controlled with ATC Ativan and comfort care measures in place.  Assessment:  Lewy Body dementia with agitation Functional and cognitive decline Minimal to nil PO intake currently Ethics input has also been requested.   Recommendations/Plan:   continue current mode of care for now  Attempt feedings/PO only when patient is alert/calm.   Continue scheduled Ativan  Will add low dose PO/SL Dilaudid to be used PRN in case patient exhibits non verbal gestures of distress/discomfort such as wincing, grimacing, furrowing of the brow etc.   PPS 10%  Goals of Care and Additional Recommendations:  Limitations on Scope of Treatment: continue to focus on patient and staff safety and to keep patient comfortable.   Code Status:    Code Status Orders  (From admission, onward)         Start     Ordered   06/15/19 1536  Do not attempt resuscitation (DNR)  Continuous    Question Answer Comment  In the event of cardiac or respiratory ARREST Do not call a "code blue"   In the event of cardiac or respiratory ARREST Do not perform Intubation, CPR, defibrillation or ACLS   In the event of cardiac or respiratory ARREST Use medication by any route, position, wound care, and other measures to relive pain and suffering. May use oxygen, suction and manual treatment of airway obstruction as needed for  comfort.      06/15/19 1535        Code Status History    This patient has a current code status but no historical code status.   Advance Care Planning Activity       Prognosis:   < 2 weeks  Discharge Planning:  To Be Determined  Care plan was discussed with  Bedside safety sitter, briefly also corresponded with Dr Hilma Favors.   Thank you for allowing the Palliative Medicine Team to assist in the care of this patient.   Time In: 10 Time Out: 10.35 Total Time 35 Prolonged Time Billed  no       Greater than 50%  of this time was spent counseling and coordinating care related to the above assessment and plan.  Loistine Chance, MD  Please contact Palliative Medicine Team phone at [phone] for questions and concerns.

## 2019-06-21 NOTE — Progress Notes (Signed)
Spiritual Care received a consult for this patient.  Patient was not alert and family was not present when I checked in.  We will attempt on a different day, but please page as needs arise.  [place], [place] Pager, [phone] 1:20 PM    06/21/19 1300  Clinical Encounter Type  Visited With Health care provider

## 2019-06-21 NOTE — BH Assessment (Signed)
[place] Assessment Progress Note  At 10:16 this writer called [facility] and spoke to [place], notifying him that pt has been medically admitted.  At 10:20 I called [place], CSW, apprising him of this and informing him that pt's current IVC will expire tomorrow, 06/22/2019.  Jalene Mullet, [place] Coordinator [phone]

## 2019-06-22 DIAGNOSIS — G3183 Dementia with Lewy bodies: Secondary | ICD-10-CM | POA: Diagnosis not present

## 2019-06-22 DIAGNOSIS — F0281 Dementia in other diseases classified elsewhere with behavioral disturbance: Secondary | ICD-10-CM | POA: Diagnosis not present

## 2019-06-22 NOTE — Ethics Note (Signed)
Ethics Consult Note:   Dr.Goldingdiscusssed this case with me on evening of 11/25. I reviewed the case with Dr. Andria Frames chair of Ethics Committee and we concur with Dr. Delanna Ahmadi recommendations. Please see note below a summary of our assessment.  My understanding is Mr. Stall is suffering from Lewy Body Dementia,  a Neurodegenerative condition which is terminal and has progressed  to the point that his behavior, due to the illness, has become extremely aggressive and makes it close to impossible to care for him. In consultation with his family they report  that this is not the man he used to be and that he would not want this to be the way he ends his life. They are also in agreement with a comfort care approach feeling that it keeps him from the distress he is experiencing due to the dementia, and that they are open to progressing to Hospice care for end of life management. If this case is viewed from the lens of alternatives it sounds like the two options are Dr.Golding's  proposal which acknowledges that this condition cannot be reversed and reducing distress and potential harm for the patient and others caring for him versus sending him to a psychiatric hospital which would separate him from his family and may or may not be able to manage his aggressive behavior.  If we think of this in terms of patient autonomy and believe that his family are the best ones to represent  his values and desires it is clear that they feel he would prefer the COmfort Care approach and to maintain his dignity.  It seems to [place] that this would be more humane, consistent with reducing distress which cannot be reversed, consistent with what his family believe he would want and what they want for him. Please contact us if further consultation is desired. [phone]  Wells Guiles  And Madison Hickman, MD  Ethics Consult Service

## 2019-06-22 NOTE — Progress Notes (Signed)
pts wife called in for an update on Jeff Simpson- her questions were answered in regards to medication, oral intake & his level of comfort/distress.

## 2019-06-22 NOTE — Progress Notes (Addendum)
Palliative Care Progress Note  Jeff Simpson is resting very peacefully, he appears to be dreaming and even smiling. He is developing increased chest congestion and not able to eat very much except a few bites of apple sauce for his medications. Since arriving on this floor and after the initial getting settled and making sure his scheduled medications were back on board he has been completely stable and comfortable for the past 48 hours.  I have updated his family. At this point we are facing end of life, his sedation is controlling his severe agitation. He is not requiring restraints now and he is able receive personal care without fighting and agitation.  Assessment:  73 yo man with end stage, terminal Lewy Body Dementia requiring sedation for severely aggressive and combative behavior. Family have established comfort and QOL as the primary goal of his care. There are no treatable underlying medical conditions that can explain his rapid deterioration. He had urinary retention and required I/O cath yesterday evening, otherwise urine output has appropriately low with decreased oral intake. Additionally he is developing chest congestion and a cough likely from aspiration of oral secretions. His prognosis is likely <2 weeks.  Recommendations:  1. No need to renew  IVC at this point, he will remain sedated and comfortable.  2. Will discuss hospice facility care with his family.  3. Continue all scheduled medications.  4. Comfort feeding only- will modify diet orders.  5. Monitor for urinary retention.  6. Will start [place] for secretions and recommend gentle turning and repositioning for his comfort.  7. Patient may be nearing EOL and it is appropriate to allow his family to visit per the policy for comfort care patients.  Lane Hacker, DO Palliative Medicine [phone]

## 2019-06-23 DIAGNOSIS — R0602 Shortness of breath: Secondary | ICD-10-CM

## 2019-06-23 DIAGNOSIS — G3183 Dementia with Lewy bodies: Secondary | ICD-10-CM | POA: Diagnosis not present

## 2019-06-23 DIAGNOSIS — Z515 Encounter for palliative care: Secondary | ICD-10-CM | POA: Diagnosis not present

## 2019-06-23 DIAGNOSIS — Z66 Do not resuscitate: Secondary | ICD-10-CM | POA: Diagnosis not present

## 2019-06-23 MED ORDER — SCOPOLAMINE 1 MG/3DAYS TD PT72
1.0000 | MEDICATED_PATCH | TRANSDERMAL | Status: DC
  Administered 2019-06-23: 16:00:00 1.5 mg via TRANSDERMAL
  Filled 2019-06-23: qty 1

## 2019-06-23 MED ORDER — ATROPINE SULFATE 1 % OP SOLN
1.0000 [drp] | Freq: Four times a day (QID) | OPHTHALMIC | Status: DC
  Administered 2019-06-23 – 2019-06-25 (×9): 1 [drp] via SUBLINGUAL
  Filled 2019-06-23: qty 2

## 2019-06-23 MED ORDER — OLANZAPINE 5 MG PO TBDP
5.0000 mg | ORAL_TABLET | Freq: Two times a day (BID) | ORAL | Status: DC
  Administered 2019-06-23 – 2019-06-25 (×4): 5 mg via ORAL
  Filled 2019-06-23 (×6): qty 1

## 2019-06-23 MED ORDER — ACETAMINOPHEN 160 MG/5ML PO SOLN
500.0000 mg | Freq: Four times a day (QID) | ORAL | Status: DC | PRN
  Administered 2019-06-23 – 2019-06-25 (×3): 500 mg via ORAL
  Filled 2019-06-23 (×3): qty 20.3

## 2019-06-23 NOTE — Progress Notes (Signed)
Daily Progress Note   Patient Name: Jeff Simpson       Date: 06/23/2019 DOB: [birth year]  Age: 73 y.o. MRN#: [id number] Attending Physician: Acquanetta Chain, DO Primary Care Physician: Marton Redwood, MD Admit Date: 06/08/2019  Reason for Consultation/Follow-up: Establishing goals of care Chart reviewed, patient seen and gently examined. PMT is primary service for this patient currently.   Subjective:  patient is resting in bed, he is not awake. He is in no distress. He does have some noisy breathing and secretions.   Patient's 2 daughters are visiting from [place], [place]. His bedside RN is also in the room. We discussed in detail about his current condition, scope of this hospitalization and appropriate disposition options, see below.    Length of Stay: 3  Current Medications: Scheduled Meds:  . atropine  1 drop Sublingual QID  . LORazepam  4 mg Sublingual Q4H  . mirtazapine  7.5 mg Oral QHS  . OLANZapine zydis  5 mg Oral BID  . scopolamine  1 patch Transdermal Q72H    Continuous Infusions:   PRN Meds: HYDROmorphone HCl, LORazepam, ziprasidone  Physical Exam         Well developed well nourished but chronically ill appearing gentleman Currently not awake No distress Abdomen mildly distended Regular respirations, coarse breath sounds upper anterior lung fields Trace edema Not agitated currently  Vital Signs: BP 123/69 (BP Location: Right Arm)   Pulse 87   Temp 98.5 F (36.9 C) (Axillary)   Resp 16   Ht _0  (1.778 m)   Wt 97.1 kg   SpO2 96%   BMI 30.72 kg/m  SpO2: SpO2: 96 % O2 Device: O2 Device: Room Air O2 Flow Rate:    Intake/output summary:   Intake/Output Summary (Last 24 hours) at 06/23/2019 1143 Last data filed at 06/23/2019 6606 Gross per 24  hour  Intake 10 ml  Output 50 ml  Net -40 ml   LBM:   Baseline Weight: Weight: 97.1 kg Most recent weight: Weight: 97.1 kg       Palliative Assessment/Data:      Patient Active Problem List   Diagnosis Date Noted  . DNR (do not resuscitate) 06/18/2019  . Palliative care patient 06/18/2019  . Lewy body dementia with behavioral disturbance ([place]) 06/17/2019  . PROSTATE SPECIFIC ANTIGEN, ELEVATED  06/03/2009  . ALLERGIC RHINITIS 05/07/2008  . SKIN CANCER, HX OF 05/07/2008  . ERECTILE DYSFUNCTION 03/24/2007  . COLONIC POLYPS, HX OF 03/24/2007    Palliative Care Assessment & Plan   Patient Profile:  73 yo with progressive Lewy Body Dementia, dangerous and severe behavioral features- controlled with ATC Ativan and comfort care measures in place.  Assessment:  Lewy Body dementia with agitation Functional and cognitive decline Minimal to nil PO intake currently Ethics input has also been requested.   Recommendations/Plan:   continue current mode of care for now  Attempt feedings/PO only when patient is alert/calm. Discussed with daughters at bedside about comfort feeds, no role for artificial nutrition/hydration.   Continue scheduled Ativan  Also on PO/SL Dilaudid to be used PRN in case patient exhibits non verbal gestures of distress/discomfort such as wincing, grimacing, furrowing of the brow etc.   PPS 10%  Family meeting: I met with the patient's 2 daughters were visiting from [facility].  We discussed in detail about this current hospitalization.  Brief life review also performed.  Patient was diagnosed with early onset Alzheimer's dementia since 2011.  He was well taken care of at his own home by his wife and by his children.  For the past few months patient had to be in a facility as the patient's wife was diagnosed with pneumonia and could not be his primary caregiver anymore.  He worked as a Barrister's clerk, owned a car show room locally and was  passionate about cars.  He has 4 children.  We discussed about appropriate symptom management.  Patient having some gurgling or coughing while Seroquel is being given in applesauce.  Discussed with bedside RN.  Will switch this out to Zyprexa orally disintegrating tablets.  Will add atropine drops sublingually as well as scopolamine patch transdermal for management of secretions.  We will have to watch out for anticholinergic side effects and adjust accordingly.  Otherwise, continue current comfort measures.  Disposition options discussed with daughters present at the bedside.  Patient will be best served in a residential hospice setting towards the end of this hospitalization.  Anticipate transfer to residential hospice, depending on bed availability, early next week.  To continue with symptom management, focusing on comfort measures and adjustment of his medication regimen for the weekend to maximize comfort.  Patient's daughters are tearful but are completely on board with current plan of care.  They desire comfort care and maintenance of dignity for the patient.  They frankly ask about prognosis.  We discussed that his prognosis could be less than 2 weeks.  End-of-life signs and symptoms also discussed.  Offered supportive care, compassionate presence and acknowledged the difficulty of the situation.  Goals of Care and Additional Recommendations:  Limitations on Scope of Treatment: continue to focus on patient and staff safety and to keep patient comfortable.   Code Status:    Code Status Orders  (From admission, onward)         Start     Ordered   06/15/19 1536  Do not attempt resuscitation (DNR)  Continuous    Question Answer Comment  In the event of cardiac or respiratory ARREST Do not call a "code blue"   In the event of cardiac or respiratory ARREST Do not perform Intubation, CPR, defibrillation or ACLS   In the event of cardiac or respiratory ARREST Use medication by any route,  position, wound care, and other measures to relive pain and suffering. May use oxygen,  suction and manual treatment of airway obstruction as needed for comfort.      06/15/19 1535        Code Status History    This patient has a current code status but no historical code status.   Advance Care Planning Activity       Prognosis:   < 2 weeks  Discharge Planning:  Likely residential hospice early next week  Care plan was discussed with RN, 2 daughters present at bedside, bedside safety sitter, briefly also corresponded with Dr Hilma Favors.   Thank you for allowing the Palliative Medicine Team to assist in the care of this patient.   Time In: 11 Time Out: 11.45 Total Time 45 Prolonged Time Billed  no       Greater than 50%  of this time was spent counseling and coordinating care related to the above assessment and plan.  Loistine Chance, MD  Please contact Palliative Medicine Team phone at [phone] for questions and concerns.

## 2019-06-24 DIAGNOSIS — G3183 Dementia with Lewy bodies: Secondary | ICD-10-CM | POA: Diagnosis not present

## 2019-06-24 DIAGNOSIS — R0602 Shortness of breath: Secondary | ICD-10-CM | POA: Diagnosis not present

## 2019-06-24 DIAGNOSIS — R52 Pain, unspecified: Secondary | ICD-10-CM

## 2019-06-24 DIAGNOSIS — Z66 Do not resuscitate: Secondary | ICD-10-CM | POA: Diagnosis not present

## 2019-06-24 DIAGNOSIS — Z515 Encounter for palliative care: Secondary | ICD-10-CM | POA: Diagnosis not present

## 2019-06-24 MED ORDER — HYDROMORPHONE HCL 1 MG/ML PO LIQD
0.5000 mg | ORAL | Status: DC
  Administered 2019-06-24 – 2019-06-25 (×8): 0.5 mg via ORAL
  Filled 2019-06-24 (×8): qty 1

## 2019-06-24 NOTE — Progress Notes (Signed)
I was about to visit patient and was told by his nurse that he was asleep and family was not present. I am going to clear this consult. Please page if pt or family needs assistance/support at another time. [place], MDiv

## 2019-06-24 NOTE — Progress Notes (Signed)
Daily Progress Note   Patient Name: Jeff Simpson       Date: 06/24/2019 DOB: 10/31/1945  Age: 73 y.o. MRN#: [id number] Attending Physician: Acquanetta Chain, DO Primary Care Physician: Marton Redwood, MD Admit Date: 06/09/2019  Reason for Consultation/Follow-up: Establishing goals of care Chart reviewed, patient seen and gently examined. PMT is primary service for this patient currently.   Subjective:  patient is resting in bed, he is not awake. He is in no distress. He does have some noisy breathing and this morning also appears to be using accessory muscles of respiration.   Patient's 2 daughters are visiting from [place], [place] and are at his bedside again this am. We reviewed end of life signs and symptoms. Patient had a fever yesterday, discussed with them about possible etiology being aspiration vs central fever or both.  Overall plan is to continue to maximize comfort care.     Length of Stay: 4  Current Medications: Scheduled Meds:  . atropine  1 drop Sublingual QID  . HYDROmorphone HCl  0.5 mg Oral Q4H  . LORazepam  4 mg Sublingual Q4H  . mirtazapine  7.5 mg Oral QHS  . OLANZapine zydis  5 mg Oral BID  . scopolamine  1 patch Transdermal Q72H    Continuous Infusions:   PRN Meds: acetaminophen (TYLENOL) oral liquid 160 mg/5 mL, HYDROmorphone HCl, LORazepam, ziprasidone  Physical Exam         Well developed well nourished but chronically ill appearing gentleman Currently not awake No distress Abdomen mildly distended Regular respirations, coarse breath sounds upper anterior lung fields Some use of neck and diaphragm accessory muscles for respiration.  "sunken" appearance to his face, more pale.  No coolness no mottling evident.  Trace edema Not agitated  currently  Vital Signs: BP (!) 143/68   Pulse (!) 110   Temp 98.7 F (37.1 C) (Oral)   Resp 16   Ht 5\' 10"  (1.778 m)   Wt 97.1 kg   SpO2 (!) 78%   BMI 30.72 kg/m  SpO2: SpO2: (!) 78 % O2 Device: O2 Device: Room Air O2 Flow Rate:    Intake/output summary:   Intake/Output Summary (Last 24 hours) at 06/24/2019 1034 Last data filed at 06/24/2019 0934 Gross per 24 hour  Intake 0 ml  Output 100 ml  Net -100 ml   LBM:   Baseline Weight: Weight: 97.1 kg Most recent weight: Weight: 97.1 kg       Palliative Assessment/Data:      Patient Active Problem List   Diagnosis Date Noted  . DNR (do not resuscitate) 06/18/2019  . Palliative care patient 06/18/2019  . Lewy body dementia with behavioral disturbance ([facility]) 06/17/2019  . PROSTATE SPECIFIC ANTIGEN, ELEVATED 06/03/2009  . ALLERGIC RHINITIS 05/07/2008  . SKIN CANCER, HX OF 05/07/2008  . ERECTILE DYSFUNCTION 03/24/2007  . COLONIC POLYPS, HX OF 03/24/2007    Palliative Care Assessment & Plan   Patient Profile:  73 yo with progressive Lewy Body Dementia, dangerous and severe behavioral features- controlled with ATC Ativan and comfort care measures in place.  Assessment:  Lewy Body dementia with agitation Functional and cognitive decline Minimal to nil PO intake currently Ethics input has also been requested.   Recommendations/Plan:   continue current mode of care for now  Attempt feedings/PO only when patient is alert/calm. Discussed with daughters at bedside about comfort feeds, no role for artificial nutrition/hydration.   Continue scheduled Ativan  Also on PO/SL Dilaudid to be used PRN in case patient exhibits non verbal gestures of distress/discomfort such as wincing, grimacing, furrowing of the brow etc. Add PO Scheduled Dilaudid today, discussed with daughter and bedside RN.   PPS 10%  Prognosis appears to be hours to some very limited number of days at this point. Discussed with daughters at bedside  in detail, if appropriate, patient will be considered for residential hospice, if not deemed stable to tolerate transfer, it is possible that comfort measures will be continued here and anticipated hospital death is possible. Family is aware.   Patient's wife might also be coming in to visit with patient, request bedside RN to arrange for comfort cart for family if possible.    End-of-life signs and symptoms also discussed.  Offered supportive care, compassionate presence and acknowledged the difficulty of the situation.  Goals of Care and Additional Recommendations:  Limitations on Scope of Treatment: continue to focus on patient and staff safety and to keep patient comfortable.   Code Status:    Code Status Orders  (From admission, onward)         Start     Ordered   06/15/19 1536  Do not attempt resuscitation (DNR)  Continuous    Question Answer Comment  In the event of cardiac or respiratory ARREST Do not call a "code blue"   In the event of cardiac or respiratory ARREST Do not perform Intubation, CPR, defibrillation or ACLS   In the event of cardiac or respiratory ARREST Use medication by any route, position, wound care, and other measures to relive pain and suffering. May use oxygen, suction and manual treatment of airway obstruction as needed for comfort.      06/15/19 1535        Code Status History    This patient has a current code status but no historical code status.   Advance Care Planning Activity       Prognosis:   hours to days.   Discharge Planning:  Likely residential hospice early next week versus anticipated hospital death, depending on the patient's condition.   Care plan was discussed with RN, 2 daughters present at bedside   Thank you for allowing the Palliative Medicine Team to assist in the care of this patient.   Time In: 10 Time Out: 10.35 Total Time 35 Prolonged Time Billed  no       Greater than 50%  of this time was spent counseling  and coordinating care related to the above assessment and plan.  Loistine Chance, MD  Please contact Palliative Medicine Team phone at [phone] for questions and concerns.

## 2019-06-25 DIAGNOSIS — Z515 Encounter for palliative care: Secondary | ICD-10-CM | POA: Diagnosis not present

## 2019-06-25 DIAGNOSIS — Z66 Do not resuscitate: Secondary | ICD-10-CM | POA: Diagnosis not present

## 2019-06-25 DIAGNOSIS — G3183 Dementia with Lewy bodies: Secondary | ICD-10-CM | POA: Diagnosis not present

## 2019-06-25 DIAGNOSIS — F0281 Dementia in other diseases classified elsewhere with behavioral disturbance: Secondary | ICD-10-CM | POA: Diagnosis not present

## 2019-06-26 NOTE — Discharge Summary (Signed)
Death Summary  Jeff Simpson [id number] DOB: 1946/03/07 DOA: 2019/06/19  PCP: Marton Redwood, MD PCP/Office notified: yes  Admit date: 2019-06-19 Date of Death: 07-07-2019  Final Diagnoses:  Active Problems:   Lewy body dementia with behavioral disturbance [facility])   DNR (do not resuscitate)   Palliative care patient  Hospital Course:  Patient was admitted to palliative care inpatient service on 06/20/19 for terminal care in the setting of Lewy Body Dementia with severe behavior symptoms. Prior to his inpatient admission he was involuntarily committed and was being managed in the [facility] in the [place]. Medical work-up for underlying reversible causes of his decompensated dementia behavior were negative. Patient had very difficult to control aggressive behavior and required increasing doses of sedation including Geodon, Ativan and Seroquel for his safety and the safety of staff. Family requested a palliative care consult in the setting of progressive, irreversible Lewy body dementia. They did not want him moved to a psychiatric hospital and instead requested comfort and dignity be preserved and clearly requested comfort be the goal even if that meant he would be sedated. Patient required scheduled Ativan. He developed mild chest congestion and fever last PM likely secondary to secondary to aspiration vs. thermoregulation of terminal neurodegenerative disease.  He expired at 17:13. Family was at bedside. I provided support and answered their questions.   Time: 35 minutes  Signed:  Lane Hacker  [facility] 2019/07/07, 6:31 PM

## 2019-06-26 NOTE — Care Management Important Message (Signed)
Important Message  Patient Details IM Letter given to [facility] SW to present to the Patient Name: Jeff Simpson MRN: [id number] Date of Birth: 09-18-1945   Medicare Important Message Given:  Yes     Kerin Salen 2019/07/19, 11:26 AM

## 2019-06-26 DEATH — deceased

## 2019-07-27 NOTE — Progress Notes (Signed)
Pts son stepped into the hallway and asked if this RN would please come into the room as he thinks his father has stopped breathing. Upon entering the room this RN saw no work of breathing and auscultated for heart sounds with none heard. Jeff Nora RN called into the room to verify pt passed. Jeff Favors, MD paged to let know of passing and MD arrived on the unit to be with family shortly after page. Pts wife and son at the bedside and given as much time as needed, shutting the door to ensure privacy. Pt belongings given to wife as she left. Pts body was transported to the morgue after post mortem care given.

## 2020-02-27 IMAGING — CR DG CHEST 2V
2 series · 2 of 2 positions shown · non-contrast
Comparison: 3200

CLINICAL DATA: Change in behavior

EXAM:
CHEST - 2 VIEW

[w chest lat]
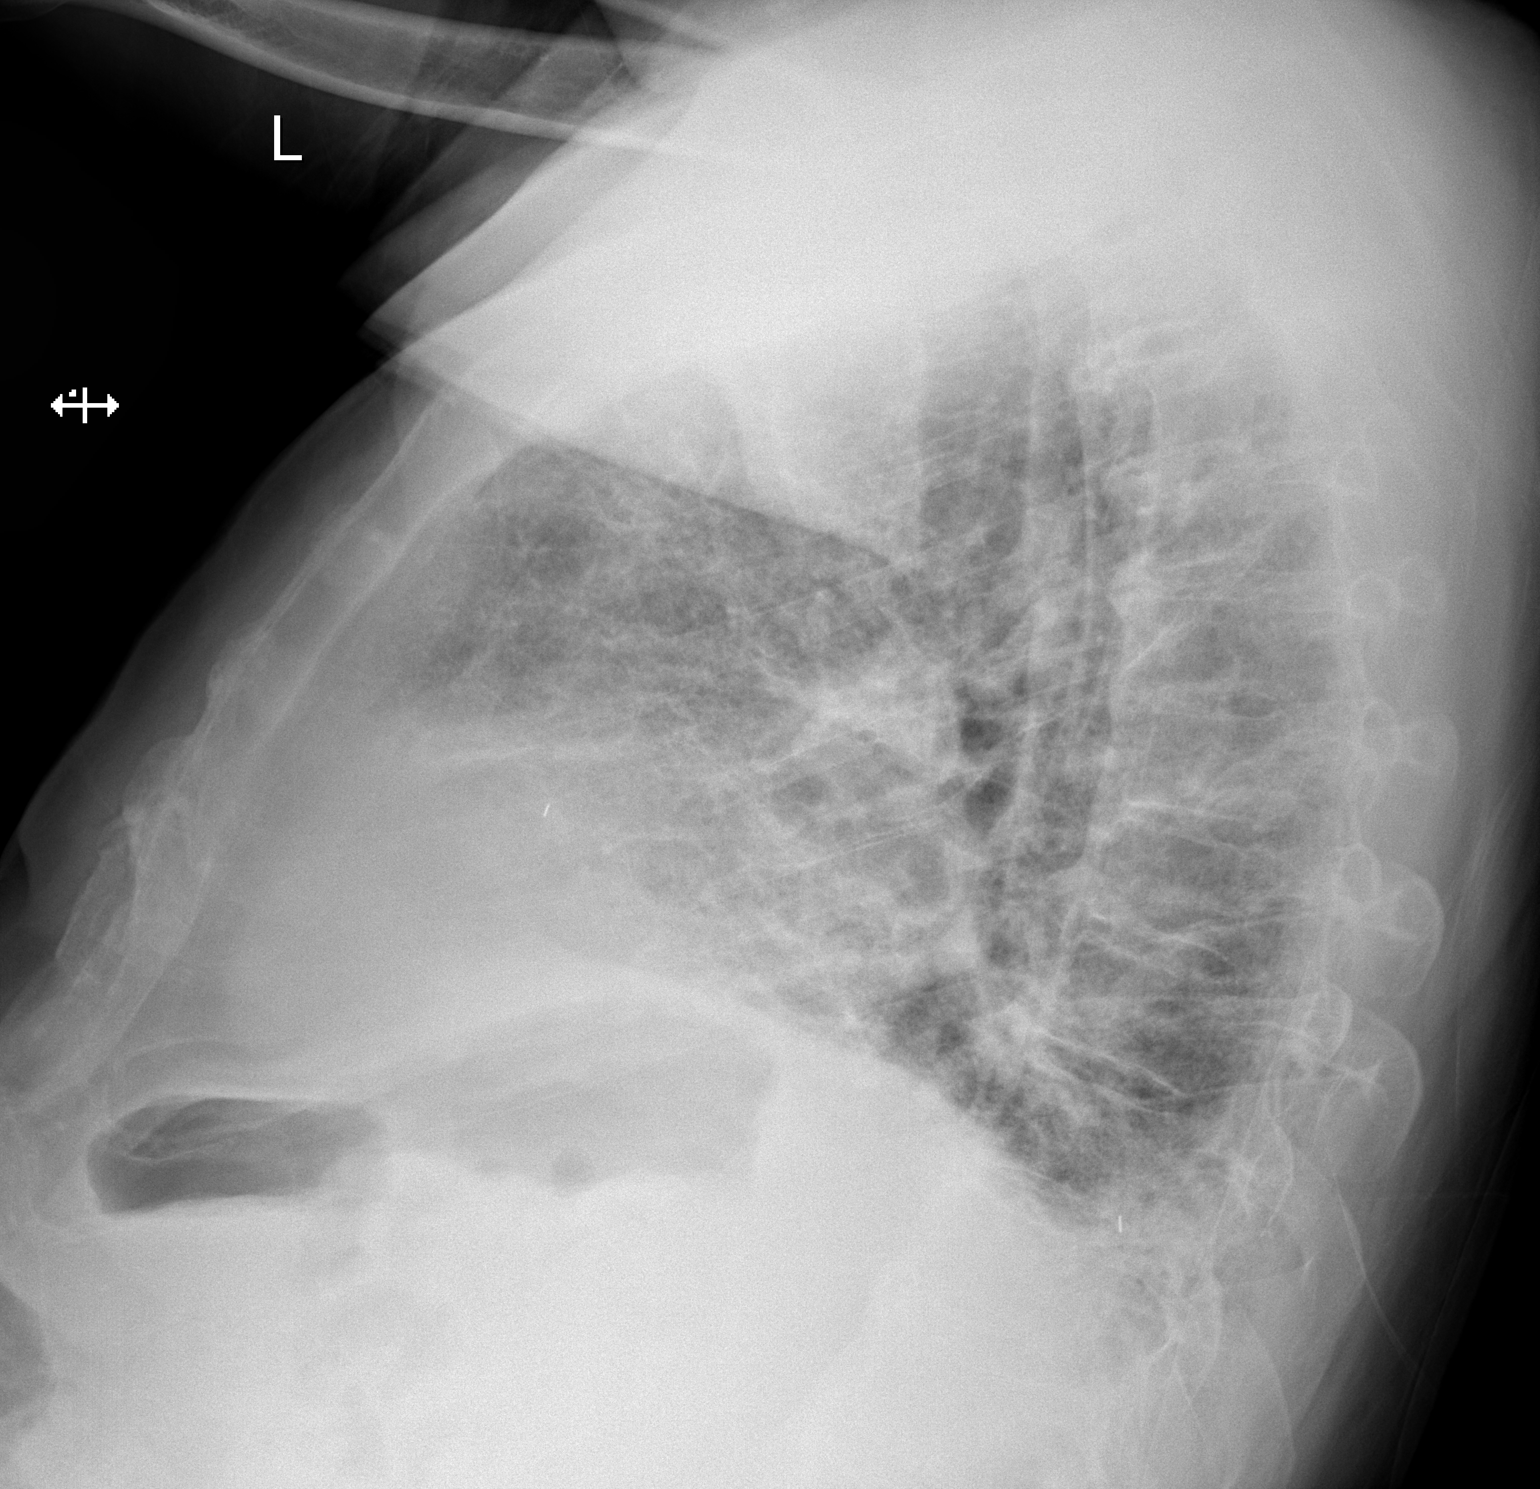

[x chest ap]
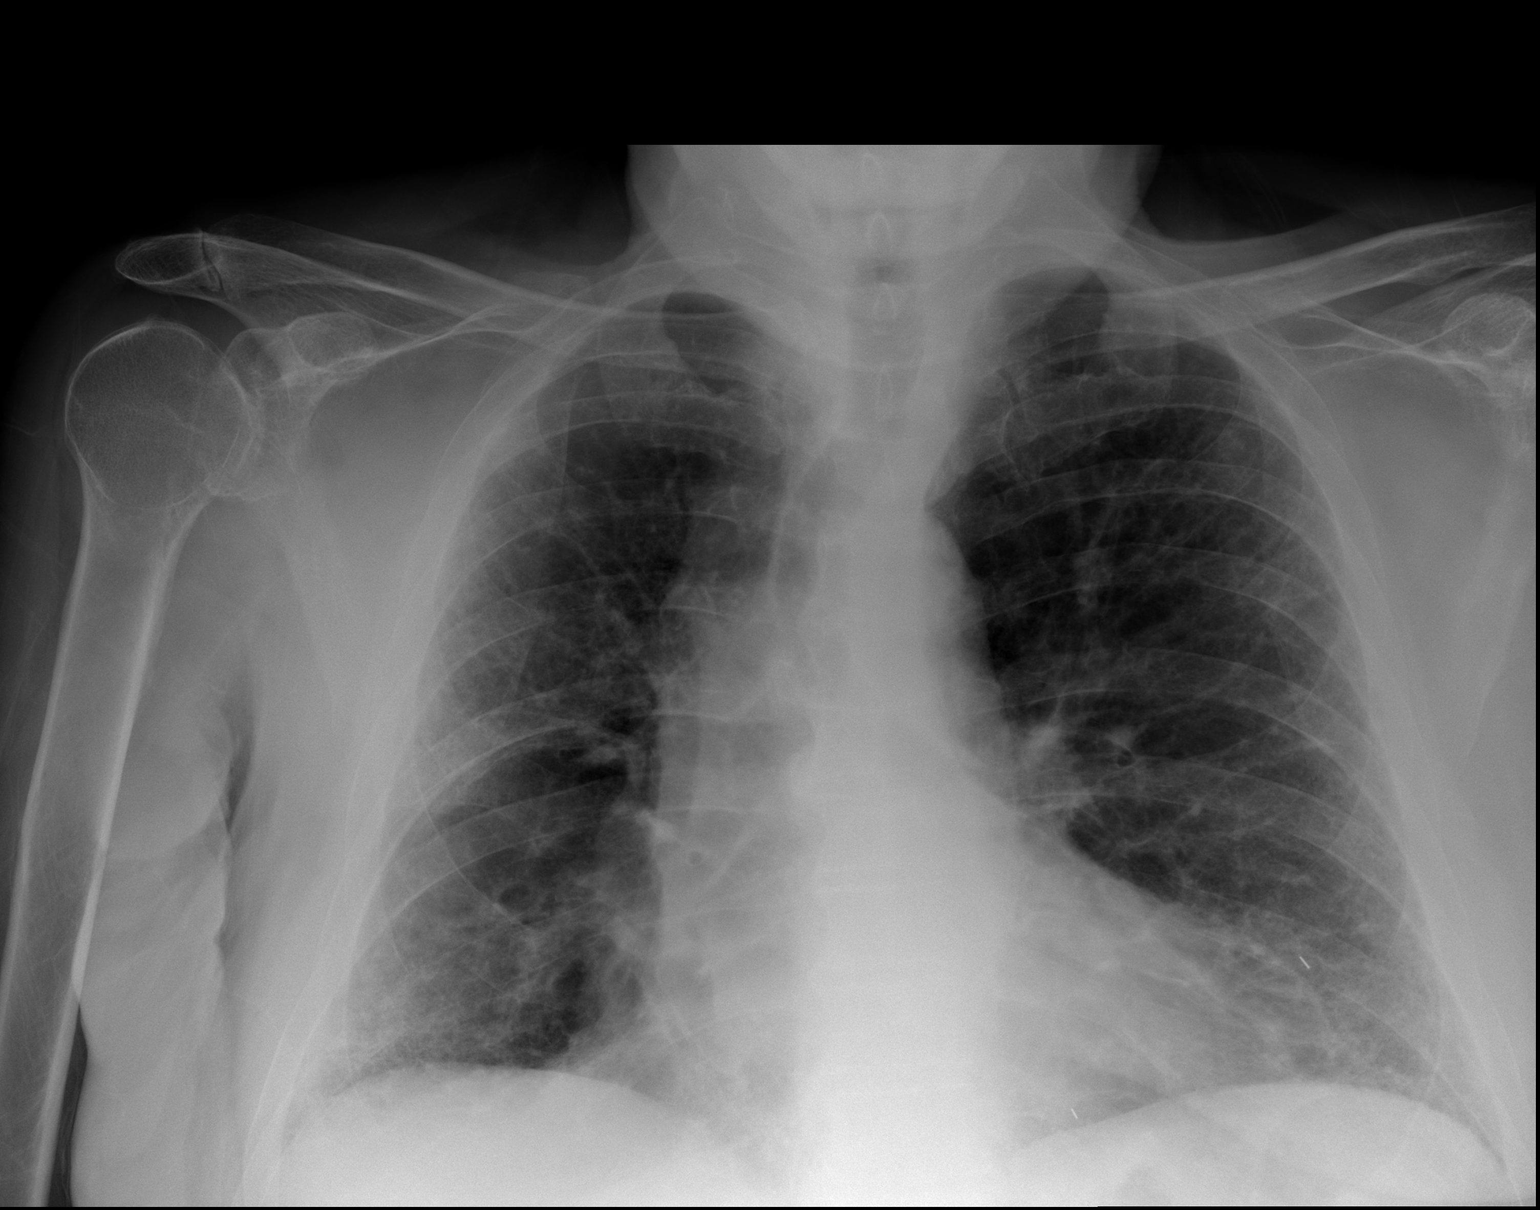

[2 of 2 positions shown; findings below may reference images not displayed]

FINDINGS: Increased interstitial prominence particularly at the lung bases. No
significant pleural effusion or pneumothorax. Heart size is normal.
IMPRESSION: Increased interstitial prominence particularly at the lung bases,
which may reflect fibrotic lung disease.
